# Patient Record
Sex: Female | Born: 1945 | Race: White | Hispanic: No | Marital: Married | Smoking: Never smoker
Health system: Southern US, Community
[De-identification: ages and names within clinical notes are randomized; demographics above are authoritative.]

## PROBLEM LIST (undated history)

## (undated) DIAGNOSIS — K589 Irritable bowel syndrome without diarrhea: Secondary | ICD-10-CM

## (undated) DIAGNOSIS — E7800 Pure hypercholesterolemia, unspecified: Secondary | ICD-10-CM

## (undated) DIAGNOSIS — F410 Panic disorder [episodic paroxysmal anxiety] without agoraphobia: Secondary | ICD-10-CM

## (undated) DIAGNOSIS — F32A Depression, unspecified: Secondary | ICD-10-CM

## (undated) DIAGNOSIS — M1990 Unspecified osteoarthritis, unspecified site: Secondary | ICD-10-CM

## (undated) DIAGNOSIS — F329 Major depressive disorder, single episode, unspecified: Secondary | ICD-10-CM

## (undated) DIAGNOSIS — I341 Nonrheumatic mitral (valve) prolapse: Secondary | ICD-10-CM

## (undated) DIAGNOSIS — F419 Anxiety disorder, unspecified: Secondary | ICD-10-CM

## (undated) DIAGNOSIS — K219 Gastro-esophageal reflux disease without esophagitis: Secondary | ICD-10-CM

## (undated) HISTORY — PX: ABDOMINAL HYSTERECTOMY: SHX81

## (undated) HISTORY — PX: TONSILLECTOMY: SUR1361

## (undated) HISTORY — DX: Unspecified osteoarthritis, unspecified site: M19.90

## (undated) HISTORY — DX: Pure hypercholesterolemia, unspecified: E78.00

## (undated) HISTORY — DX: Depression, unspecified: F32.A

## (undated) HISTORY — PX: BLADDER SURGERY: SHX569

## (undated) HISTORY — DX: Major depressive disorder, single episode, unspecified: F32.9

## (undated) HISTORY — DX: Irritable bowel syndrome, unspecified: K58.9

## (undated) HISTORY — DX: Gastro-esophageal reflux disease without esophagitis: K21.9

## (undated) HISTORY — PX: COLONOSCOPY WITH ESOPHAGOGASTRODUODENOSCOPY (EGD): SHX5779

## (undated) HISTORY — DX: Anxiety disorder, unspecified: F41.9

## (undated) HISTORY — DX: Nonrheumatic mitral (valve) prolapse: I34.1

## (undated) HISTORY — DX: Panic disorder (episodic paroxysmal anxiety): F41.0

## (undated) HISTORY — PX: BACK SURGERY: SHX140

---

## 1996-04-18 HISTORY — PX: HERNIA REPAIR: SHX51

## 2010-03-10 HISTORY — PX: ESOPHAGOGASTRODUODENOSCOPY: SHX1529

## 2011-04-21 DIAGNOSIS — H538 Other visual disturbances: Secondary | ICD-10-CM | POA: Diagnosis not present

## 2011-04-27 DIAGNOSIS — H26499 Other secondary cataract, unspecified eye: Secondary | ICD-10-CM | POA: Diagnosis not present

## 2011-05-14 DIAGNOSIS — J209 Acute bronchitis, unspecified: Secondary | ICD-10-CM | POA: Diagnosis not present

## 2011-05-14 DIAGNOSIS — J018 Other acute sinusitis: Secondary | ICD-10-CM | POA: Diagnosis not present

## 2011-05-16 DIAGNOSIS — J019 Acute sinusitis, unspecified: Secondary | ICD-10-CM | POA: Diagnosis not present

## 2011-07-14 DIAGNOSIS — L820 Inflamed seborrheic keratosis: Secondary | ICD-10-CM | POA: Diagnosis not present

## 2011-07-14 DIAGNOSIS — L719 Rosacea, unspecified: Secondary | ICD-10-CM | POA: Diagnosis not present

## 2011-07-14 DIAGNOSIS — L723 Sebaceous cyst: Secondary | ICD-10-CM | POA: Diagnosis not present

## 2011-08-17 DIAGNOSIS — L570 Actinic keratosis: Secondary | ICD-10-CM | POA: Diagnosis not present

## 2011-09-29 DIAGNOSIS — L821 Other seborrheic keratosis: Secondary | ICD-10-CM | POA: Diagnosis not present

## 2011-09-29 DIAGNOSIS — L723 Sebaceous cyst: Secondary | ICD-10-CM | POA: Diagnosis not present

## 2011-10-12 DIAGNOSIS — H029 Unspecified disorder of eyelid: Secondary | ICD-10-CM | POA: Diagnosis not present

## 2011-10-27 DIAGNOSIS — H11159 Pinguecula, unspecified eye: Secondary | ICD-10-CM | POA: Diagnosis not present

## 2011-12-01 DIAGNOSIS — IMO0001 Reserved for inherently not codable concepts without codable children: Secondary | ICD-10-CM | POA: Diagnosis not present

## 2011-12-01 DIAGNOSIS — D485 Neoplasm of uncertain behavior of skin: Secondary | ICD-10-CM | POA: Diagnosis not present

## 2011-12-01 DIAGNOSIS — L821 Other seborrheic keratosis: Secondary | ICD-10-CM | POA: Diagnosis not present

## 2011-12-01 DIAGNOSIS — D492 Neoplasm of unspecified behavior of bone, soft tissue, and skin: Secondary | ICD-10-CM | POA: Diagnosis not present

## 2011-12-29 DIAGNOSIS — Z23 Encounter for immunization: Secondary | ICD-10-CM | POA: Diagnosis not present

## 2012-03-27 DIAGNOSIS — M75100 Unspecified rotator cuff tear or rupture of unspecified shoulder, not specified as traumatic: Secondary | ICD-10-CM | POA: Diagnosis not present

## 2012-04-26 DIAGNOSIS — K219 Gastro-esophageal reflux disease without esophagitis: Secondary | ICD-10-CM | POA: Diagnosis not present

## 2012-04-26 DIAGNOSIS — Z79899 Other long term (current) drug therapy: Secondary | ICD-10-CM | POA: Diagnosis not present

## 2012-04-26 DIAGNOSIS — E7800 Pure hypercholesterolemia, unspecified: Secondary | ICD-10-CM | POA: Diagnosis not present

## 2012-04-26 DIAGNOSIS — F411 Generalized anxiety disorder: Secondary | ICD-10-CM | POA: Diagnosis not present

## 2012-05-09 DIAGNOSIS — Z0000 Encounter for general adult medical examination without abnormal findings: Secondary | ICD-10-CM | POA: Diagnosis not present

## 2012-06-05 DIAGNOSIS — M5090 Cervical disc disorder, unspecified, unspecified cervical region: Secondary | ICD-10-CM | POA: Diagnosis not present

## 2012-06-05 DIAGNOSIS — F411 Generalized anxiety disorder: Secondary | ICD-10-CM | POA: Diagnosis not present

## 2012-06-05 DIAGNOSIS — Z23 Encounter for immunization: Secondary | ICD-10-CM | POA: Diagnosis not present

## 2012-06-05 DIAGNOSIS — M75100 Unspecified rotator cuff tear or rupture of unspecified shoulder, not specified as traumatic: Secondary | ICD-10-CM | POA: Diagnosis not present

## 2012-06-13 DIAGNOSIS — M25519 Pain in unspecified shoulder: Secondary | ICD-10-CM | POA: Diagnosis not present

## 2012-06-14 DIAGNOSIS — Z1231 Encounter for screening mammogram for malignant neoplasm of breast: Secondary | ICD-10-CM | POA: Diagnosis not present

## 2012-06-21 DIAGNOSIS — M25519 Pain in unspecified shoulder: Secondary | ICD-10-CM | POA: Diagnosis not present

## 2012-06-26 DIAGNOSIS — M25519 Pain in unspecified shoulder: Secondary | ICD-10-CM | POA: Diagnosis not present

## 2012-06-28 DIAGNOSIS — M5137 Other intervertebral disc degeneration, lumbosacral region: Secondary | ICD-10-CM | POA: Diagnosis not present

## 2012-06-28 DIAGNOSIS — M999 Biomechanical lesion, unspecified: Secondary | ICD-10-CM | POA: Diagnosis not present

## 2012-06-28 DIAGNOSIS — M25519 Pain in unspecified shoulder: Secondary | ICD-10-CM | POA: Diagnosis not present

## 2012-06-29 DIAGNOSIS — M999 Biomechanical lesion, unspecified: Secondary | ICD-10-CM | POA: Diagnosis not present

## 2012-06-29 DIAGNOSIS — M5137 Other intervertebral disc degeneration, lumbosacral region: Secondary | ICD-10-CM | POA: Diagnosis not present

## 2012-06-29 DIAGNOSIS — M9981 Other biomechanical lesions of cervical region: Secondary | ICD-10-CM | POA: Diagnosis not present

## 2012-07-02 DIAGNOSIS — M9981 Other biomechanical lesions of cervical region: Secondary | ICD-10-CM | POA: Diagnosis not present

## 2012-07-03 DIAGNOSIS — M5137 Other intervertebral disc degeneration, lumbosacral region: Secondary | ICD-10-CM | POA: Diagnosis not present

## 2012-07-03 DIAGNOSIS — M9981 Other biomechanical lesions of cervical region: Secondary | ICD-10-CM | POA: Diagnosis not present

## 2012-07-03 DIAGNOSIS — M25519 Pain in unspecified shoulder: Secondary | ICD-10-CM | POA: Diagnosis not present

## 2012-07-06 DIAGNOSIS — M9981 Other biomechanical lesions of cervical region: Secondary | ICD-10-CM | POA: Diagnosis not present

## 2012-07-06 DIAGNOSIS — M999 Biomechanical lesion, unspecified: Secondary | ICD-10-CM | POA: Diagnosis not present

## 2012-07-06 DIAGNOSIS — M5137 Other intervertebral disc degeneration, lumbosacral region: Secondary | ICD-10-CM | POA: Diagnosis not present

## 2012-07-09 DIAGNOSIS — M5137 Other intervertebral disc degeneration, lumbosacral region: Secondary | ICD-10-CM | POA: Diagnosis not present

## 2012-07-09 DIAGNOSIS — M9981 Other biomechanical lesions of cervical region: Secondary | ICD-10-CM | POA: Diagnosis not present

## 2012-07-09 DIAGNOSIS — M999 Biomechanical lesion, unspecified: Secondary | ICD-10-CM | POA: Diagnosis not present

## 2012-07-10 DIAGNOSIS — M999 Biomechanical lesion, unspecified: Secondary | ICD-10-CM | POA: Diagnosis not present

## 2012-07-10 DIAGNOSIS — M25519 Pain in unspecified shoulder: Secondary | ICD-10-CM | POA: Diagnosis not present

## 2012-07-10 DIAGNOSIS — M5137 Other intervertebral disc degeneration, lumbosacral region: Secondary | ICD-10-CM | POA: Diagnosis not present

## 2012-07-10 DIAGNOSIS — M9981 Other biomechanical lesions of cervical region: Secondary | ICD-10-CM | POA: Diagnosis not present

## 2012-07-12 DIAGNOSIS — M25519 Pain in unspecified shoulder: Secondary | ICD-10-CM | POA: Diagnosis not present

## 2012-07-13 DIAGNOSIS — M9981 Other biomechanical lesions of cervical region: Secondary | ICD-10-CM | POA: Diagnosis not present

## 2012-07-13 DIAGNOSIS — M5137 Other intervertebral disc degeneration, lumbosacral region: Secondary | ICD-10-CM | POA: Diagnosis not present

## 2012-07-13 DIAGNOSIS — M999 Biomechanical lesion, unspecified: Secondary | ICD-10-CM | POA: Diagnosis not present

## 2012-07-16 DIAGNOSIS — M9981 Other biomechanical lesions of cervical region: Secondary | ICD-10-CM | POA: Diagnosis not present

## 2012-07-16 DIAGNOSIS — M999 Biomechanical lesion, unspecified: Secondary | ICD-10-CM | POA: Diagnosis not present

## 2012-07-16 DIAGNOSIS — M5137 Other intervertebral disc degeneration, lumbosacral region: Secondary | ICD-10-CM | POA: Diagnosis not present

## 2012-07-17 DIAGNOSIS — M25519 Pain in unspecified shoulder: Secondary | ICD-10-CM | POA: Diagnosis not present

## 2012-07-17 DIAGNOSIS — M999 Biomechanical lesion, unspecified: Secondary | ICD-10-CM | POA: Diagnosis not present

## 2012-07-17 DIAGNOSIS — M9981 Other biomechanical lesions of cervical region: Secondary | ICD-10-CM | POA: Diagnosis not present

## 2012-07-17 DIAGNOSIS — M5137 Other intervertebral disc degeneration, lumbosacral region: Secondary | ICD-10-CM | POA: Diagnosis not present

## 2012-07-20 DIAGNOSIS — M25519 Pain in unspecified shoulder: Secondary | ICD-10-CM | POA: Diagnosis not present

## 2012-07-23 DIAGNOSIS — M9981 Other biomechanical lesions of cervical region: Secondary | ICD-10-CM | POA: Diagnosis not present

## 2012-07-23 DIAGNOSIS — M25519 Pain in unspecified shoulder: Secondary | ICD-10-CM | POA: Diagnosis not present

## 2012-07-23 DIAGNOSIS — M5137 Other intervertebral disc degeneration, lumbosacral region: Secondary | ICD-10-CM | POA: Diagnosis not present

## 2012-07-23 DIAGNOSIS — M999 Biomechanical lesion, unspecified: Secondary | ICD-10-CM | POA: Diagnosis not present

## 2012-07-24 DIAGNOSIS — K5900 Constipation, unspecified: Secondary | ICD-10-CM | POA: Diagnosis not present

## 2012-07-24 DIAGNOSIS — K589 Irritable bowel syndrome without diarrhea: Secondary | ICD-10-CM | POA: Diagnosis not present

## 2012-07-24 DIAGNOSIS — Z1211 Encounter for screening for malignant neoplasm of colon: Secondary | ICD-10-CM | POA: Diagnosis not present

## 2012-07-25 DIAGNOSIS — M25519 Pain in unspecified shoulder: Secondary | ICD-10-CM | POA: Diagnosis not present

## 2012-07-26 DIAGNOSIS — Z23 Encounter for immunization: Secondary | ICD-10-CM | POA: Diagnosis not present

## 2012-07-30 DIAGNOSIS — M25519 Pain in unspecified shoulder: Secondary | ICD-10-CM | POA: Diagnosis not present

## 2012-08-06 DIAGNOSIS — S43499A Other sprain of unspecified shoulder joint, initial encounter: Secondary | ICD-10-CM | POA: Diagnosis not present

## 2012-08-06 DIAGNOSIS — S46819A Strain of other muscles, fascia and tendons at shoulder and upper arm level, unspecified arm, initial encounter: Secondary | ICD-10-CM | POA: Diagnosis not present

## 2012-08-06 DIAGNOSIS — M25819 Other specified joint disorders, unspecified shoulder: Secondary | ICD-10-CM | POA: Diagnosis not present

## 2012-08-06 DIAGNOSIS — X58XXXA Exposure to other specified factors, initial encounter: Secondary | ICD-10-CM | POA: Diagnosis not present

## 2012-08-06 DIAGNOSIS — M25519 Pain in unspecified shoulder: Secondary | ICD-10-CM | POA: Diagnosis not present

## 2012-08-06 DIAGNOSIS — M19019 Primary osteoarthritis, unspecified shoulder: Secondary | ICD-10-CM | POA: Diagnosis not present

## 2012-08-08 DIAGNOSIS — M66329 Spontaneous rupture of flexor tendons, unspecified upper arm: Secondary | ICD-10-CM | POA: Diagnosis not present

## 2012-08-08 DIAGNOSIS — M75120 Complete rotator cuff tear or rupture of unspecified shoulder, not specified as traumatic: Secondary | ICD-10-CM | POA: Diagnosis not present

## 2012-08-08 DIAGNOSIS — M6281 Muscle weakness (generalized): Secondary | ICD-10-CM | POA: Diagnosis not present

## 2012-08-22 DIAGNOSIS — M75120 Complete rotator cuff tear or rupture of unspecified shoulder, not specified as traumatic: Secondary | ICD-10-CM | POA: Diagnosis not present

## 2012-08-22 DIAGNOSIS — M25519 Pain in unspecified shoulder: Secondary | ICD-10-CM | POA: Diagnosis not present

## 2012-08-30 DIAGNOSIS — M75120 Complete rotator cuff tear or rupture of unspecified shoulder, not specified as traumatic: Secondary | ICD-10-CM | POA: Diagnosis not present

## 2012-08-30 DIAGNOSIS — M25519 Pain in unspecified shoulder: Secondary | ICD-10-CM | POA: Diagnosis not present

## 2012-09-04 DIAGNOSIS — M25519 Pain in unspecified shoulder: Secondary | ICD-10-CM | POA: Diagnosis not present

## 2012-09-04 DIAGNOSIS — M75120 Complete rotator cuff tear or rupture of unspecified shoulder, not specified as traumatic: Secondary | ICD-10-CM | POA: Diagnosis not present

## 2012-09-06 DIAGNOSIS — M25519 Pain in unspecified shoulder: Secondary | ICD-10-CM | POA: Diagnosis not present

## 2012-09-06 DIAGNOSIS — M75120 Complete rotator cuff tear or rupture of unspecified shoulder, not specified as traumatic: Secondary | ICD-10-CM | POA: Diagnosis not present

## 2012-09-12 DIAGNOSIS — M75120 Complete rotator cuff tear or rupture of unspecified shoulder, not specified as traumatic: Secondary | ICD-10-CM | POA: Diagnosis not present

## 2012-09-12 DIAGNOSIS — M25519 Pain in unspecified shoulder: Secondary | ICD-10-CM | POA: Diagnosis not present

## 2012-09-14 DIAGNOSIS — K589 Irritable bowel syndrome without diarrhea: Secondary | ICD-10-CM | POA: Diagnosis not present

## 2012-09-14 DIAGNOSIS — Z5309 Procedure and treatment not carried out because of other contraindication: Secondary | ICD-10-CM | POA: Diagnosis not present

## 2012-09-14 DIAGNOSIS — Z1211 Encounter for screening for malignant neoplasm of colon: Secondary | ICD-10-CM | POA: Diagnosis not present

## 2012-09-14 DIAGNOSIS — K5900 Constipation, unspecified: Secondary | ICD-10-CM | POA: Diagnosis not present

## 2012-09-20 DIAGNOSIS — M75120 Complete rotator cuff tear or rupture of unspecified shoulder, not specified as traumatic: Secondary | ICD-10-CM | POA: Diagnosis not present

## 2012-09-20 DIAGNOSIS — M25519 Pain in unspecified shoulder: Secondary | ICD-10-CM | POA: Diagnosis not present

## 2012-10-24 DIAGNOSIS — M75120 Complete rotator cuff tear or rupture of unspecified shoulder, not specified as traumatic: Secondary | ICD-10-CM | POA: Diagnosis not present

## 2012-10-25 DIAGNOSIS — K5900 Constipation, unspecified: Secondary | ICD-10-CM | POA: Diagnosis not present

## 2012-10-25 DIAGNOSIS — Z1211 Encounter for screening for malignant neoplasm of colon: Secondary | ICD-10-CM | POA: Diagnosis not present

## 2012-11-01 DIAGNOSIS — L719 Rosacea, unspecified: Secondary | ICD-10-CM | POA: Diagnosis not present

## 2012-11-01 DIAGNOSIS — L538 Other specified erythematous conditions: Secondary | ICD-10-CM | POA: Diagnosis not present

## 2012-11-09 DIAGNOSIS — Z1211 Encounter for screening for malignant neoplasm of colon: Secondary | ICD-10-CM | POA: Diagnosis not present

## 2012-11-09 DIAGNOSIS — K5730 Diverticulosis of large intestine without perforation or abscess without bleeding: Secondary | ICD-10-CM | POA: Diagnosis not present

## 2012-11-09 DIAGNOSIS — K648 Other hemorrhoids: Secondary | ICD-10-CM | POA: Diagnosis not present

## 2012-11-09 HISTORY — PX: COLONOSCOPY: SHX174

## 2012-12-31 DIAGNOSIS — H21239 Degeneration of iris (pigmentary), unspecified eye: Secondary | ICD-10-CM | POA: Diagnosis not present

## 2013-01-17 DIAGNOSIS — Z23 Encounter for immunization: Secondary | ICD-10-CM | POA: Diagnosis not present

## 2013-02-26 DIAGNOSIS — R079 Chest pain, unspecified: Secondary | ICD-10-CM | POA: Diagnosis not present

## 2013-02-26 DIAGNOSIS — S43429A Sprain of unspecified rotator cuff capsule, initial encounter: Secondary | ICD-10-CM | POA: Diagnosis not present

## 2013-02-27 DIAGNOSIS — Z006 Encounter for examination for normal comparison and control in clinical research program: Secondary | ICD-10-CM | POA: Diagnosis not present

## 2013-02-27 DIAGNOSIS — F411 Generalized anxiety disorder: Secondary | ICD-10-CM | POA: Diagnosis not present

## 2013-02-27 DIAGNOSIS — K219 Gastro-esophageal reflux disease without esophagitis: Secondary | ICD-10-CM | POA: Diagnosis not present

## 2013-02-27 DIAGNOSIS — E7800 Pure hypercholesterolemia, unspecified: Secondary | ICD-10-CM | POA: Diagnosis not present

## 2013-05-09 DIAGNOSIS — H16429 Pannus (corneal), unspecified eye: Secondary | ICD-10-CM | POA: Diagnosis not present

## 2013-05-16 DIAGNOSIS — F410 Panic disorder [episodic paroxysmal anxiety] without agoraphobia: Secondary | ICD-10-CM | POA: Diagnosis not present

## 2013-05-16 DIAGNOSIS — Z79899 Other long term (current) drug therapy: Secondary | ICD-10-CM | POA: Diagnosis not present

## 2013-05-16 DIAGNOSIS — F411 Generalized anxiety disorder: Secondary | ICD-10-CM | POA: Diagnosis not present

## 2013-05-16 DIAGNOSIS — E7800 Pure hypercholesterolemia, unspecified: Secondary | ICD-10-CM | POA: Diagnosis not present

## 2013-06-09 DIAGNOSIS — G8929 Other chronic pain: Secondary | ICD-10-CM | POA: Diagnosis not present

## 2013-06-09 DIAGNOSIS — K219 Gastro-esophageal reflux disease without esophagitis: Secondary | ICD-10-CM | POA: Diagnosis not present

## 2013-06-09 DIAGNOSIS — R51 Headache: Secondary | ICD-10-CM | POA: Diagnosis not present

## 2013-06-09 DIAGNOSIS — M542 Cervicalgia: Secondary | ICD-10-CM | POA: Diagnosis not present

## 2013-06-09 DIAGNOSIS — Z79899 Other long term (current) drug therapy: Secondary | ICD-10-CM | POA: Diagnosis not present

## 2013-06-09 DIAGNOSIS — E7800 Pure hypercholesterolemia, unspecified: Secondary | ICD-10-CM | POA: Diagnosis not present

## 2013-06-09 DIAGNOSIS — M545 Low back pain, unspecified: Secondary | ICD-10-CM | POA: Diagnosis not present

## 2013-07-01 DIAGNOSIS — H04129 Dry eye syndrome of unspecified lacrimal gland: Secondary | ICD-10-CM | POA: Diagnosis not present

## 2013-08-02 DIAGNOSIS — N644 Mastodynia: Secondary | ICD-10-CM | POA: Diagnosis not present

## 2013-08-02 DIAGNOSIS — E559 Vitamin D deficiency, unspecified: Secondary | ICD-10-CM | POA: Diagnosis not present

## 2013-08-02 DIAGNOSIS — Z0000 Encounter for general adult medical examination without abnormal findings: Secondary | ICD-10-CM | POA: Diagnosis not present

## 2013-08-02 DIAGNOSIS — M549 Dorsalgia, unspecified: Secondary | ICD-10-CM | POA: Diagnosis not present

## 2013-09-03 DIAGNOSIS — S339XXA Sprain of unspecified parts of lumbar spine and pelvis, initial encounter: Secondary | ICD-10-CM | POA: Diagnosis not present

## 2013-09-03 DIAGNOSIS — M999 Biomechanical lesion, unspecified: Secondary | ICD-10-CM | POA: Diagnosis not present

## 2013-09-04 DIAGNOSIS — M999 Biomechanical lesion, unspecified: Secondary | ICD-10-CM | POA: Diagnosis not present

## 2013-09-04 DIAGNOSIS — S339XXA Sprain of unspecified parts of lumbar spine and pelvis, initial encounter: Secondary | ICD-10-CM | POA: Diagnosis not present

## 2013-09-05 DIAGNOSIS — S339XXA Sprain of unspecified parts of lumbar spine and pelvis, initial encounter: Secondary | ICD-10-CM | POA: Diagnosis not present

## 2013-09-05 DIAGNOSIS — M999 Biomechanical lesion, unspecified: Secondary | ICD-10-CM | POA: Diagnosis not present

## 2013-09-10 DIAGNOSIS — M999 Biomechanical lesion, unspecified: Secondary | ICD-10-CM | POA: Diagnosis not present

## 2013-09-10 DIAGNOSIS — S339XXA Sprain of unspecified parts of lumbar spine and pelvis, initial encounter: Secondary | ICD-10-CM | POA: Diagnosis not present

## 2013-09-11 DIAGNOSIS — S339XXA Sprain of unspecified parts of lumbar spine and pelvis, initial encounter: Secondary | ICD-10-CM | POA: Diagnosis not present

## 2013-09-11 DIAGNOSIS — M999 Biomechanical lesion, unspecified: Secondary | ICD-10-CM | POA: Diagnosis not present

## 2013-09-12 DIAGNOSIS — S339XXA Sprain of unspecified parts of lumbar spine and pelvis, initial encounter: Secondary | ICD-10-CM | POA: Diagnosis not present

## 2013-09-12 DIAGNOSIS — M999 Biomechanical lesion, unspecified: Secondary | ICD-10-CM | POA: Diagnosis not present

## 2013-09-16 DIAGNOSIS — S339XXA Sprain of unspecified parts of lumbar spine and pelvis, initial encounter: Secondary | ICD-10-CM | POA: Diagnosis not present

## 2013-09-16 DIAGNOSIS — M999 Biomechanical lesion, unspecified: Secondary | ICD-10-CM | POA: Diagnosis not present

## 2013-09-17 DIAGNOSIS — M999 Biomechanical lesion, unspecified: Secondary | ICD-10-CM | POA: Diagnosis not present

## 2013-09-17 DIAGNOSIS — S339XXA Sprain of unspecified parts of lumbar spine and pelvis, initial encounter: Secondary | ICD-10-CM | POA: Diagnosis not present

## 2013-09-19 DIAGNOSIS — M999 Biomechanical lesion, unspecified: Secondary | ICD-10-CM | POA: Diagnosis not present

## 2013-09-19 DIAGNOSIS — S339XXA Sprain of unspecified parts of lumbar spine and pelvis, initial encounter: Secondary | ICD-10-CM | POA: Diagnosis not present

## 2013-09-23 DIAGNOSIS — M999 Biomechanical lesion, unspecified: Secondary | ICD-10-CM | POA: Diagnosis not present

## 2013-09-23 DIAGNOSIS — S339XXA Sprain of unspecified parts of lumbar spine and pelvis, initial encounter: Secondary | ICD-10-CM | POA: Diagnosis not present

## 2013-09-24 DIAGNOSIS — S339XXA Sprain of unspecified parts of lumbar spine and pelvis, initial encounter: Secondary | ICD-10-CM | POA: Diagnosis not present

## 2013-09-24 DIAGNOSIS — M999 Biomechanical lesion, unspecified: Secondary | ICD-10-CM | POA: Diagnosis not present

## 2013-09-26 DIAGNOSIS — S339XXA Sprain of unspecified parts of lumbar spine and pelvis, initial encounter: Secondary | ICD-10-CM | POA: Diagnosis not present

## 2013-09-26 DIAGNOSIS — M999 Biomechanical lesion, unspecified: Secondary | ICD-10-CM | POA: Diagnosis not present

## 2013-10-04 DIAGNOSIS — Z1231 Encounter for screening mammogram for malignant neoplasm of breast: Secondary | ICD-10-CM | POA: Diagnosis not present

## 2013-10-07 DIAGNOSIS — M999 Biomechanical lesion, unspecified: Secondary | ICD-10-CM | POA: Diagnosis not present

## 2013-10-07 DIAGNOSIS — S339XXA Sprain of unspecified parts of lumbar spine and pelvis, initial encounter: Secondary | ICD-10-CM | POA: Diagnosis not present

## 2013-10-10 DIAGNOSIS — M999 Biomechanical lesion, unspecified: Secondary | ICD-10-CM | POA: Diagnosis not present

## 2013-10-10 DIAGNOSIS — S339XXA Sprain of unspecified parts of lumbar spine and pelvis, initial encounter: Secondary | ICD-10-CM | POA: Diagnosis not present

## 2013-10-14 DIAGNOSIS — M999 Biomechanical lesion, unspecified: Secondary | ICD-10-CM | POA: Diagnosis not present

## 2013-10-14 DIAGNOSIS — S339XXA Sprain of unspecified parts of lumbar spine and pelvis, initial encounter: Secondary | ICD-10-CM | POA: Diagnosis not present

## 2013-10-17 DIAGNOSIS — F410 Panic disorder [episodic paroxysmal anxiety] without agoraphobia: Secondary | ICD-10-CM | POA: Diagnosis not present

## 2013-10-17 DIAGNOSIS — M999 Biomechanical lesion, unspecified: Secondary | ICD-10-CM | POA: Diagnosis not present

## 2013-10-17 DIAGNOSIS — E7800 Pure hypercholesterolemia, unspecified: Secondary | ICD-10-CM | POA: Diagnosis not present

## 2013-10-17 DIAGNOSIS — S339XXA Sprain of unspecified parts of lumbar spine and pelvis, initial encounter: Secondary | ICD-10-CM | POA: Diagnosis not present

## 2013-10-23 DIAGNOSIS — S339XXA Sprain of unspecified parts of lumbar spine and pelvis, initial encounter: Secondary | ICD-10-CM | POA: Diagnosis not present

## 2013-10-23 DIAGNOSIS — M999 Biomechanical lesion, unspecified: Secondary | ICD-10-CM | POA: Diagnosis not present

## 2013-11-06 DIAGNOSIS — M75120 Complete rotator cuff tear or rupture of unspecified shoulder, not specified as traumatic: Secondary | ICD-10-CM | POA: Diagnosis not present

## 2013-11-06 DIAGNOSIS — M999 Biomechanical lesion, unspecified: Secondary | ICD-10-CM | POA: Diagnosis not present

## 2013-11-06 DIAGNOSIS — M25519 Pain in unspecified shoulder: Secondary | ICD-10-CM | POA: Diagnosis not present

## 2013-11-06 DIAGNOSIS — S339XXA Sprain of unspecified parts of lumbar spine and pelvis, initial encounter: Secondary | ICD-10-CM | POA: Diagnosis not present

## 2013-11-07 DIAGNOSIS — D2330 Other benign neoplasm of skin of unspecified part of face: Secondary | ICD-10-CM | POA: Diagnosis not present

## 2013-11-07 DIAGNOSIS — L719 Rosacea, unspecified: Secondary | ICD-10-CM | POA: Diagnosis not present

## 2013-11-25 DIAGNOSIS — N952 Postmenopausal atrophic vaginitis: Secondary | ICD-10-CM | POA: Diagnosis not present

## 2013-11-25 DIAGNOSIS — N3941 Urge incontinence: Secondary | ICD-10-CM | POA: Diagnosis not present

## 2013-11-29 DIAGNOSIS — R413 Other amnesia: Secondary | ICD-10-CM | POA: Diagnosis not present

## 2014-01-06 DIAGNOSIS — Z23 Encounter for immunization: Secondary | ICD-10-CM | POA: Diagnosis not present

## 2014-02-12 DIAGNOSIS — S90852A Superficial foreign body, left foot, initial encounter: Secondary | ICD-10-CM | POA: Diagnosis not present

## 2014-02-13 ENCOUNTER — Ambulatory Visit: Payer: Self-pay

## 2014-03-24 DIAGNOSIS — M75121 Complete rotator cuff tear or rupture of right shoulder, not specified as traumatic: Secondary | ICD-10-CM | POA: Diagnosis not present

## 2014-03-24 DIAGNOSIS — M25511 Pain in right shoulder: Secondary | ICD-10-CM | POA: Diagnosis not present

## 2014-07-01 DIAGNOSIS — H26493 Other secondary cataract, bilateral: Secondary | ICD-10-CM | POA: Diagnosis not present

## 2014-07-01 DIAGNOSIS — H04123 Dry eye syndrome of bilateral lacrimal glands: Secondary | ICD-10-CM | POA: Diagnosis not present

## 2014-07-31 DIAGNOSIS — M9903 Segmental and somatic dysfunction of lumbar region: Secondary | ICD-10-CM | POA: Diagnosis not present

## 2014-07-31 DIAGNOSIS — S338XXA Sprain of other parts of lumbar spine and pelvis, initial encounter: Secondary | ICD-10-CM | POA: Diagnosis not present

## 2014-08-04 DIAGNOSIS — M9903 Segmental and somatic dysfunction of lumbar region: Secondary | ICD-10-CM | POA: Diagnosis not present

## 2014-08-04 DIAGNOSIS — S338XXA Sprain of other parts of lumbar spine and pelvis, initial encounter: Secondary | ICD-10-CM | POA: Diagnosis not present

## 2014-08-06 DIAGNOSIS — S338XXA Sprain of other parts of lumbar spine and pelvis, initial encounter: Secondary | ICD-10-CM | POA: Diagnosis not present

## 2014-08-06 DIAGNOSIS — M9903 Segmental and somatic dysfunction of lumbar region: Secondary | ICD-10-CM | POA: Diagnosis not present

## 2014-08-07 DIAGNOSIS — S338XXA Sprain of other parts of lumbar spine and pelvis, initial encounter: Secondary | ICD-10-CM | POA: Diagnosis not present

## 2014-08-07 DIAGNOSIS — M9903 Segmental and somatic dysfunction of lumbar region: Secondary | ICD-10-CM | POA: Diagnosis not present

## 2014-08-14 DIAGNOSIS — S338XXA Sprain of other parts of lumbar spine and pelvis, initial encounter: Secondary | ICD-10-CM | POA: Diagnosis not present

## 2014-08-14 DIAGNOSIS — M9903 Segmental and somatic dysfunction of lumbar region: Secondary | ICD-10-CM | POA: Diagnosis not present

## 2014-08-18 DIAGNOSIS — S338XXA Sprain of other parts of lumbar spine and pelvis, initial encounter: Secondary | ICD-10-CM | POA: Diagnosis not present

## 2014-08-18 DIAGNOSIS — M9903 Segmental and somatic dysfunction of lumbar region: Secondary | ICD-10-CM | POA: Diagnosis not present

## 2014-08-21 DIAGNOSIS — L814 Other melanin hyperpigmentation: Secondary | ICD-10-CM | POA: Diagnosis not present

## 2014-08-21 DIAGNOSIS — L719 Rosacea, unspecified: Secondary | ICD-10-CM | POA: Diagnosis not present

## 2014-08-21 DIAGNOSIS — L820 Inflamed seborrheic keratosis: Secondary | ICD-10-CM | POA: Diagnosis not present

## 2014-08-25 DIAGNOSIS — S338XXA Sprain of other parts of lumbar spine and pelvis, initial encounter: Secondary | ICD-10-CM | POA: Diagnosis not present

## 2014-08-25 DIAGNOSIS — M9903 Segmental and somatic dysfunction of lumbar region: Secondary | ICD-10-CM | POA: Diagnosis not present

## 2014-08-28 DIAGNOSIS — M9903 Segmental and somatic dysfunction of lumbar region: Secondary | ICD-10-CM | POA: Diagnosis not present

## 2014-08-28 DIAGNOSIS — S338XXA Sprain of other parts of lumbar spine and pelvis, initial encounter: Secondary | ICD-10-CM | POA: Diagnosis not present

## 2014-09-01 DIAGNOSIS — M9903 Segmental and somatic dysfunction of lumbar region: Secondary | ICD-10-CM | POA: Diagnosis not present

## 2014-09-01 DIAGNOSIS — S338XXA Sprain of other parts of lumbar spine and pelvis, initial encounter: Secondary | ICD-10-CM | POA: Diagnosis not present

## 2014-09-03 DIAGNOSIS — M25511 Pain in right shoulder: Secondary | ICD-10-CM | POA: Diagnosis not present

## 2014-09-03 DIAGNOSIS — M75121 Complete rotator cuff tear or rupture of right shoulder, not specified as traumatic: Secondary | ICD-10-CM | POA: Diagnosis not present

## 2014-09-08 DIAGNOSIS — M9903 Segmental and somatic dysfunction of lumbar region: Secondary | ICD-10-CM | POA: Diagnosis not present

## 2014-09-08 DIAGNOSIS — S338XXA Sprain of other parts of lumbar spine and pelvis, initial encounter: Secondary | ICD-10-CM | POA: Diagnosis not present

## 2014-09-11 DIAGNOSIS — M75101 Unspecified rotator cuff tear or rupture of right shoulder, not specified as traumatic: Secondary | ICD-10-CM | POA: Diagnosis not present

## 2014-09-11 DIAGNOSIS — M12811 Other specific arthropathies, not elsewhere classified, right shoulder: Secondary | ICD-10-CM | POA: Diagnosis not present

## 2014-09-11 DIAGNOSIS — M75121 Complete rotator cuff tear or rupture of right shoulder, not specified as traumatic: Secondary | ICD-10-CM | POA: Diagnosis not present

## 2014-09-11 DIAGNOSIS — M25411 Effusion, right shoulder: Secondary | ICD-10-CM | POA: Diagnosis not present

## 2014-09-19 DIAGNOSIS — M75121 Complete rotator cuff tear or rupture of right shoulder, not specified as traumatic: Secondary | ICD-10-CM | POA: Diagnosis not present

## 2014-09-25 DIAGNOSIS — J309 Allergic rhinitis, unspecified: Secondary | ICD-10-CM | POA: Diagnosis not present

## 2014-09-25 DIAGNOSIS — M75121 Complete rotator cuff tear or rupture of right shoulder, not specified as traumatic: Secondary | ICD-10-CM | POA: Diagnosis not present

## 2014-09-25 DIAGNOSIS — G8918 Other acute postprocedural pain: Secondary | ICD-10-CM | POA: Diagnosis not present

## 2014-09-25 DIAGNOSIS — Z7982 Long term (current) use of aspirin: Secondary | ICD-10-CM | POA: Diagnosis not present

## 2014-09-25 DIAGNOSIS — M7541 Impingement syndrome of right shoulder: Secondary | ICD-10-CM | POA: Diagnosis not present

## 2014-09-25 DIAGNOSIS — M7540 Impingement syndrome of unspecified shoulder: Secondary | ICD-10-CM | POA: Diagnosis not present

## 2014-09-25 DIAGNOSIS — S46811A Strain of other muscles, fascia and tendons at shoulder and upper arm level, right arm, initial encounter: Secondary | ICD-10-CM | POA: Diagnosis not present

## 2014-09-25 DIAGNOSIS — Z79899 Other long term (current) drug therapy: Secondary | ICD-10-CM | POA: Diagnosis not present

## 2014-09-25 DIAGNOSIS — F410 Panic disorder [episodic paroxysmal anxiety] without agoraphobia: Secondary | ICD-10-CM | POA: Diagnosis not present

## 2014-09-25 DIAGNOSIS — K219 Gastro-esophageal reflux disease without esophagitis: Secondary | ICD-10-CM | POA: Diagnosis not present

## 2014-09-25 DIAGNOSIS — M65811 Other synovitis and tenosynovitis, right shoulder: Secondary | ICD-10-CM | POA: Diagnosis not present

## 2014-09-25 DIAGNOSIS — E785 Hyperlipidemia, unspecified: Secondary | ICD-10-CM | POA: Diagnosis not present

## 2014-09-25 DIAGNOSIS — E559 Vitamin D deficiency, unspecified: Secondary | ICD-10-CM | POA: Diagnosis not present

## 2014-09-25 DIAGNOSIS — M25519 Pain in unspecified shoulder: Secondary | ICD-10-CM | POA: Diagnosis not present

## 2014-09-25 DIAGNOSIS — K760 Fatty (change of) liver, not elsewhere classified: Secondary | ICD-10-CM | POA: Diagnosis not present

## 2014-09-29 DIAGNOSIS — M25511 Pain in right shoulder: Secondary | ICD-10-CM | POA: Diagnosis not present

## 2014-09-29 DIAGNOSIS — M75121 Complete rotator cuff tear or rupture of right shoulder, not specified as traumatic: Secondary | ICD-10-CM | POA: Diagnosis not present

## 2014-09-29 DIAGNOSIS — M25611 Stiffness of right shoulder, not elsewhere classified: Secondary | ICD-10-CM | POA: Diagnosis not present

## 2014-09-29 DIAGNOSIS — M6281 Muscle weakness (generalized): Secondary | ICD-10-CM | POA: Diagnosis not present

## 2014-10-01 DIAGNOSIS — M6281 Muscle weakness (generalized): Secondary | ICD-10-CM | POA: Diagnosis not present

## 2014-10-01 DIAGNOSIS — M25511 Pain in right shoulder: Secondary | ICD-10-CM | POA: Diagnosis not present

## 2014-10-01 DIAGNOSIS — M25611 Stiffness of right shoulder, not elsewhere classified: Secondary | ICD-10-CM | POA: Diagnosis not present

## 2014-10-01 DIAGNOSIS — M75121 Complete rotator cuff tear or rupture of right shoulder, not specified as traumatic: Secondary | ICD-10-CM | POA: Diagnosis not present

## 2014-10-06 DIAGNOSIS — M6281 Muscle weakness (generalized): Secondary | ICD-10-CM | POA: Diagnosis not present

## 2014-10-06 DIAGNOSIS — M25611 Stiffness of right shoulder, not elsewhere classified: Secondary | ICD-10-CM | POA: Diagnosis not present

## 2014-10-06 DIAGNOSIS — M25511 Pain in right shoulder: Secondary | ICD-10-CM | POA: Diagnosis not present

## 2014-10-06 DIAGNOSIS — M75121 Complete rotator cuff tear or rupture of right shoulder, not specified as traumatic: Secondary | ICD-10-CM | POA: Diagnosis not present

## 2014-10-08 DIAGNOSIS — M25611 Stiffness of right shoulder, not elsewhere classified: Secondary | ICD-10-CM | POA: Diagnosis not present

## 2014-10-08 DIAGNOSIS — M75121 Complete rotator cuff tear or rupture of right shoulder, not specified as traumatic: Secondary | ICD-10-CM | POA: Diagnosis not present

## 2014-10-08 DIAGNOSIS — M6281 Muscle weakness (generalized): Secondary | ICD-10-CM | POA: Diagnosis not present

## 2014-10-08 DIAGNOSIS — M25511 Pain in right shoulder: Secondary | ICD-10-CM | POA: Diagnosis not present

## 2014-10-10 DIAGNOSIS — M75121 Complete rotator cuff tear or rupture of right shoulder, not specified as traumatic: Secondary | ICD-10-CM | POA: Diagnosis not present

## 2014-10-10 DIAGNOSIS — M25611 Stiffness of right shoulder, not elsewhere classified: Secondary | ICD-10-CM | POA: Diagnosis not present

## 2014-10-10 DIAGNOSIS — M25511 Pain in right shoulder: Secondary | ICD-10-CM | POA: Diagnosis not present

## 2014-10-10 DIAGNOSIS — M6281 Muscle weakness (generalized): Secondary | ICD-10-CM | POA: Diagnosis not present

## 2014-10-13 DIAGNOSIS — M75121 Complete rotator cuff tear or rupture of right shoulder, not specified as traumatic: Secondary | ICD-10-CM | POA: Diagnosis not present

## 2014-10-13 DIAGNOSIS — M25611 Stiffness of right shoulder, not elsewhere classified: Secondary | ICD-10-CM | POA: Diagnosis not present

## 2014-10-13 DIAGNOSIS — M25511 Pain in right shoulder: Secondary | ICD-10-CM | POA: Diagnosis not present

## 2014-10-13 DIAGNOSIS — M6281 Muscle weakness (generalized): Secondary | ICD-10-CM | POA: Diagnosis not present

## 2014-10-15 DIAGNOSIS — M25511 Pain in right shoulder: Secondary | ICD-10-CM | POA: Diagnosis not present

## 2014-10-15 DIAGNOSIS — M25611 Stiffness of right shoulder, not elsewhere classified: Secondary | ICD-10-CM | POA: Diagnosis not present

## 2014-10-15 DIAGNOSIS — M75121 Complete rotator cuff tear or rupture of right shoulder, not specified as traumatic: Secondary | ICD-10-CM | POA: Diagnosis not present

## 2014-10-15 DIAGNOSIS — M6281 Muscle weakness (generalized): Secondary | ICD-10-CM | POA: Diagnosis not present

## 2014-10-22 DIAGNOSIS — M25511 Pain in right shoulder: Secondary | ICD-10-CM | POA: Diagnosis not present

## 2014-10-22 DIAGNOSIS — M6281 Muscle weakness (generalized): Secondary | ICD-10-CM | POA: Diagnosis not present

## 2014-10-22 DIAGNOSIS — M25611 Stiffness of right shoulder, not elsewhere classified: Secondary | ICD-10-CM | POA: Diagnosis not present

## 2014-10-22 DIAGNOSIS — M75121 Complete rotator cuff tear or rupture of right shoulder, not specified as traumatic: Secondary | ICD-10-CM | POA: Diagnosis not present

## 2014-10-24 DIAGNOSIS — M25611 Stiffness of right shoulder, not elsewhere classified: Secondary | ICD-10-CM | POA: Diagnosis not present

## 2014-10-24 DIAGNOSIS — M25511 Pain in right shoulder: Secondary | ICD-10-CM | POA: Diagnosis not present

## 2014-10-24 DIAGNOSIS — M6281 Muscle weakness (generalized): Secondary | ICD-10-CM | POA: Diagnosis not present

## 2014-10-24 DIAGNOSIS — M75121 Complete rotator cuff tear or rupture of right shoulder, not specified as traumatic: Secondary | ICD-10-CM | POA: Diagnosis not present

## 2014-10-27 DIAGNOSIS — M25611 Stiffness of right shoulder, not elsewhere classified: Secondary | ICD-10-CM | POA: Diagnosis not present

## 2014-10-27 DIAGNOSIS — M75121 Complete rotator cuff tear or rupture of right shoulder, not specified as traumatic: Secondary | ICD-10-CM | POA: Diagnosis not present

## 2014-10-27 DIAGNOSIS — M6281 Muscle weakness (generalized): Secondary | ICD-10-CM | POA: Diagnosis not present

## 2014-10-27 DIAGNOSIS — M25511 Pain in right shoulder: Secondary | ICD-10-CM | POA: Diagnosis not present

## 2014-10-29 DIAGNOSIS — M6281 Muscle weakness (generalized): Secondary | ICD-10-CM | POA: Diagnosis not present

## 2014-10-29 DIAGNOSIS — M25611 Stiffness of right shoulder, not elsewhere classified: Secondary | ICD-10-CM | POA: Diagnosis not present

## 2014-10-29 DIAGNOSIS — M25511 Pain in right shoulder: Secondary | ICD-10-CM | POA: Diagnosis not present

## 2014-10-29 DIAGNOSIS — M75121 Complete rotator cuff tear or rupture of right shoulder, not specified as traumatic: Secondary | ICD-10-CM | POA: Diagnosis not present

## 2014-10-31 DIAGNOSIS — M25611 Stiffness of right shoulder, not elsewhere classified: Secondary | ICD-10-CM | POA: Diagnosis not present

## 2014-10-31 DIAGNOSIS — M25511 Pain in right shoulder: Secondary | ICD-10-CM | POA: Diagnosis not present

## 2014-10-31 DIAGNOSIS — M6281 Muscle weakness (generalized): Secondary | ICD-10-CM | POA: Diagnosis not present

## 2014-10-31 DIAGNOSIS — M75121 Complete rotator cuff tear or rupture of right shoulder, not specified as traumatic: Secondary | ICD-10-CM | POA: Diagnosis not present

## 2014-11-03 DIAGNOSIS — M25611 Stiffness of right shoulder, not elsewhere classified: Secondary | ICD-10-CM | POA: Diagnosis not present

## 2014-11-03 DIAGNOSIS — M25511 Pain in right shoulder: Secondary | ICD-10-CM | POA: Diagnosis not present

## 2014-11-03 DIAGNOSIS — M75121 Complete rotator cuff tear or rupture of right shoulder, not specified as traumatic: Secondary | ICD-10-CM | POA: Diagnosis not present

## 2014-11-03 DIAGNOSIS — M6281 Muscle weakness (generalized): Secondary | ICD-10-CM | POA: Diagnosis not present

## 2014-11-05 DIAGNOSIS — M25511 Pain in right shoulder: Secondary | ICD-10-CM | POA: Diagnosis not present

## 2014-11-05 DIAGNOSIS — M75121 Complete rotator cuff tear or rupture of right shoulder, not specified as traumatic: Secondary | ICD-10-CM | POA: Diagnosis not present

## 2014-11-05 DIAGNOSIS — M25611 Stiffness of right shoulder, not elsewhere classified: Secondary | ICD-10-CM | POA: Diagnosis not present

## 2014-11-05 DIAGNOSIS — M6281 Muscle weakness (generalized): Secondary | ICD-10-CM | POA: Diagnosis not present

## 2014-11-11 DIAGNOSIS — Z0000 Encounter for general adult medical examination without abnormal findings: Secondary | ICD-10-CM | POA: Diagnosis not present

## 2014-11-11 DIAGNOSIS — Z79899 Other long term (current) drug therapy: Secondary | ICD-10-CM | POA: Diagnosis not present

## 2014-11-11 DIAGNOSIS — F410 Panic disorder [episodic paroxysmal anxiety] without agoraphobia: Secondary | ICD-10-CM | POA: Diagnosis not present

## 2014-11-11 DIAGNOSIS — K219 Gastro-esophageal reflux disease without esophagitis: Secondary | ICD-10-CM | POA: Diagnosis not present

## 2014-11-11 DIAGNOSIS — E780 Pure hypercholesterolemia: Secondary | ICD-10-CM | POA: Diagnosis not present

## 2014-11-12 DIAGNOSIS — M75121 Complete rotator cuff tear or rupture of right shoulder, not specified as traumatic: Secondary | ICD-10-CM | POA: Diagnosis not present

## 2014-11-12 DIAGNOSIS — M25511 Pain in right shoulder: Secondary | ICD-10-CM | POA: Diagnosis not present

## 2014-11-12 DIAGNOSIS — M25611 Stiffness of right shoulder, not elsewhere classified: Secondary | ICD-10-CM | POA: Diagnosis not present

## 2014-11-12 DIAGNOSIS — M6281 Muscle weakness (generalized): Secondary | ICD-10-CM | POA: Diagnosis not present

## 2014-11-14 DIAGNOSIS — M6281 Muscle weakness (generalized): Secondary | ICD-10-CM | POA: Diagnosis not present

## 2014-11-14 DIAGNOSIS — M25511 Pain in right shoulder: Secondary | ICD-10-CM | POA: Diagnosis not present

## 2014-11-14 DIAGNOSIS — M25611 Stiffness of right shoulder, not elsewhere classified: Secondary | ICD-10-CM | POA: Diagnosis not present

## 2014-11-14 DIAGNOSIS — M75121 Complete rotator cuff tear or rupture of right shoulder, not specified as traumatic: Secondary | ICD-10-CM | POA: Diagnosis not present

## 2014-11-17 DIAGNOSIS — N644 Mastodynia: Secondary | ICD-10-CM | POA: Diagnosis not present

## 2014-11-19 DIAGNOSIS — M6281 Muscle weakness (generalized): Secondary | ICD-10-CM | POA: Diagnosis not present

## 2014-11-19 DIAGNOSIS — M25511 Pain in right shoulder: Secondary | ICD-10-CM | POA: Diagnosis not present

## 2014-11-19 DIAGNOSIS — M25611 Stiffness of right shoulder, not elsewhere classified: Secondary | ICD-10-CM | POA: Diagnosis not present

## 2014-11-19 DIAGNOSIS — M75121 Complete rotator cuff tear or rupture of right shoulder, not specified as traumatic: Secondary | ICD-10-CM | POA: Diagnosis not present

## 2014-11-21 DIAGNOSIS — M25611 Stiffness of right shoulder, not elsewhere classified: Secondary | ICD-10-CM | POA: Diagnosis not present

## 2014-11-21 DIAGNOSIS — M6281 Muscle weakness (generalized): Secondary | ICD-10-CM | POA: Diagnosis not present

## 2014-11-21 DIAGNOSIS — M75121 Complete rotator cuff tear or rupture of right shoulder, not specified as traumatic: Secondary | ICD-10-CM | POA: Diagnosis not present

## 2014-11-21 DIAGNOSIS — M1990 Unspecified osteoarthritis, unspecified site: Secondary | ICD-10-CM | POA: Diagnosis not present

## 2014-11-21 DIAGNOSIS — M25511 Pain in right shoulder: Secondary | ICD-10-CM | POA: Diagnosis not present

## 2014-11-25 DIAGNOSIS — M25611 Stiffness of right shoulder, not elsewhere classified: Secondary | ICD-10-CM | POA: Diagnosis not present

## 2014-11-25 DIAGNOSIS — M6281 Muscle weakness (generalized): Secondary | ICD-10-CM | POA: Diagnosis not present

## 2014-11-25 DIAGNOSIS — M75121 Complete rotator cuff tear or rupture of right shoulder, not specified as traumatic: Secondary | ICD-10-CM | POA: Diagnosis not present

## 2014-11-25 DIAGNOSIS — M25511 Pain in right shoulder: Secondary | ICD-10-CM | POA: Diagnosis not present

## 2014-12-03 DIAGNOSIS — M25511 Pain in right shoulder: Secondary | ICD-10-CM | POA: Diagnosis not present

## 2014-12-03 DIAGNOSIS — M25611 Stiffness of right shoulder, not elsewhere classified: Secondary | ICD-10-CM | POA: Diagnosis not present

## 2014-12-03 DIAGNOSIS — M75121 Complete rotator cuff tear or rupture of right shoulder, not specified as traumatic: Secondary | ICD-10-CM | POA: Diagnosis not present

## 2014-12-03 DIAGNOSIS — M6281 Muscle weakness (generalized): Secondary | ICD-10-CM | POA: Diagnosis not present

## 2014-12-05 DIAGNOSIS — M6281 Muscle weakness (generalized): Secondary | ICD-10-CM | POA: Diagnosis not present

## 2014-12-05 DIAGNOSIS — M25611 Stiffness of right shoulder, not elsewhere classified: Secondary | ICD-10-CM | POA: Diagnosis not present

## 2014-12-05 DIAGNOSIS — M25511 Pain in right shoulder: Secondary | ICD-10-CM | POA: Diagnosis not present

## 2014-12-05 DIAGNOSIS — M75121 Complete rotator cuff tear or rupture of right shoulder, not specified as traumatic: Secondary | ICD-10-CM | POA: Diagnosis not present

## 2014-12-10 DIAGNOSIS — M6281 Muscle weakness (generalized): Secondary | ICD-10-CM | POA: Diagnosis not present

## 2014-12-10 DIAGNOSIS — M25511 Pain in right shoulder: Secondary | ICD-10-CM | POA: Diagnosis not present

## 2014-12-10 DIAGNOSIS — M75121 Complete rotator cuff tear or rupture of right shoulder, not specified as traumatic: Secondary | ICD-10-CM | POA: Diagnosis not present

## 2014-12-10 DIAGNOSIS — M25611 Stiffness of right shoulder, not elsewhere classified: Secondary | ICD-10-CM | POA: Diagnosis not present

## 2014-12-12 DIAGNOSIS — M75121 Complete rotator cuff tear or rupture of right shoulder, not specified as traumatic: Secondary | ICD-10-CM | POA: Diagnosis not present

## 2014-12-12 DIAGNOSIS — M25511 Pain in right shoulder: Secondary | ICD-10-CM | POA: Diagnosis not present

## 2014-12-12 DIAGNOSIS — M6281 Muscle weakness (generalized): Secondary | ICD-10-CM | POA: Diagnosis not present

## 2014-12-12 DIAGNOSIS — M25611 Stiffness of right shoulder, not elsewhere classified: Secondary | ICD-10-CM | POA: Diagnosis not present

## 2014-12-24 DIAGNOSIS — J40 Bronchitis, not specified as acute or chronic: Secondary | ICD-10-CM | POA: Diagnosis not present

## 2014-12-24 DIAGNOSIS — J329 Chronic sinusitis, unspecified: Secondary | ICD-10-CM | POA: Diagnosis not present

## 2015-01-15 DIAGNOSIS — J40 Bronchitis, not specified as acute or chronic: Secondary | ICD-10-CM | POA: Diagnosis not present

## 2015-02-04 DIAGNOSIS — M9903 Segmental and somatic dysfunction of lumbar region: Secondary | ICD-10-CM | POA: Diagnosis not present

## 2015-02-04 DIAGNOSIS — S338XXA Sprain of other parts of lumbar spine and pelvis, initial encounter: Secondary | ICD-10-CM | POA: Diagnosis not present

## 2015-02-05 DIAGNOSIS — M9903 Segmental and somatic dysfunction of lumbar region: Secondary | ICD-10-CM | POA: Diagnosis not present

## 2015-02-05 DIAGNOSIS — R413 Other amnesia: Secondary | ICD-10-CM | POA: Diagnosis not present

## 2015-02-05 DIAGNOSIS — S338XXA Sprain of other parts of lumbar spine and pelvis, initial encounter: Secondary | ICD-10-CM | POA: Diagnosis not present

## 2015-02-09 DIAGNOSIS — Z23 Encounter for immunization: Secondary | ICD-10-CM | POA: Diagnosis not present

## 2015-02-09 DIAGNOSIS — M9903 Segmental and somatic dysfunction of lumbar region: Secondary | ICD-10-CM | POA: Diagnosis not present

## 2015-02-09 DIAGNOSIS — S338XXA Sprain of other parts of lumbar spine and pelvis, initial encounter: Secondary | ICD-10-CM | POA: Diagnosis not present

## 2015-02-16 DIAGNOSIS — M5441 Lumbago with sciatica, right side: Secondary | ICD-10-CM | POA: Diagnosis not present

## 2015-03-03 DIAGNOSIS — M4726 Other spondylosis with radiculopathy, lumbar region: Secondary | ICD-10-CM | POA: Diagnosis not present

## 2015-03-03 DIAGNOSIS — M40209 Unspecified kyphosis, site unspecified: Secondary | ICD-10-CM | POA: Diagnosis not present

## 2015-03-03 DIAGNOSIS — M5441 Lumbago with sciatica, right side: Secondary | ICD-10-CM | POA: Diagnosis not present

## 2015-03-03 DIAGNOSIS — M545 Low back pain: Secondary | ICD-10-CM | POA: Diagnosis not present

## 2015-03-06 DIAGNOSIS — M5126 Other intervertebral disc displacement, lumbar region: Secondary | ICD-10-CM | POA: Diagnosis not present

## 2015-03-06 DIAGNOSIS — M4726 Other spondylosis with radiculopathy, lumbar region: Secondary | ICD-10-CM | POA: Diagnosis not present

## 2015-03-10 DIAGNOSIS — M25551 Pain in right hip: Secondary | ICD-10-CM | POA: Diagnosis not present

## 2015-03-10 DIAGNOSIS — M545 Low back pain: Secondary | ICD-10-CM | POA: Diagnosis not present

## 2015-03-17 DIAGNOSIS — M25551 Pain in right hip: Secondary | ICD-10-CM | POA: Diagnosis not present

## 2015-03-17 DIAGNOSIS — M545 Low back pain: Secondary | ICD-10-CM | POA: Diagnosis not present

## 2015-03-19 DIAGNOSIS — M25551 Pain in right hip: Secondary | ICD-10-CM | POA: Diagnosis not present

## 2015-03-19 DIAGNOSIS — M545 Low back pain: Secondary | ICD-10-CM | POA: Diagnosis not present

## 2015-03-24 DIAGNOSIS — M545 Low back pain: Secondary | ICD-10-CM | POA: Diagnosis not present

## 2015-03-24 DIAGNOSIS — M25551 Pain in right hip: Secondary | ICD-10-CM | POA: Diagnosis not present

## 2015-03-26 DIAGNOSIS — M419 Scoliosis, unspecified: Secondary | ICD-10-CM | POA: Diagnosis not present

## 2015-03-26 DIAGNOSIS — M4726 Other spondylosis with radiculopathy, lumbar region: Secondary | ICD-10-CM | POA: Diagnosis not present

## 2015-03-26 DIAGNOSIS — M545 Low back pain: Secondary | ICD-10-CM | POA: Diagnosis not present

## 2015-03-26 DIAGNOSIS — M5441 Lumbago with sciatica, right side: Secondary | ICD-10-CM | POA: Diagnosis not present

## 2015-03-30 DIAGNOSIS — M25512 Pain in left shoulder: Secondary | ICD-10-CM | POA: Diagnosis not present

## 2015-03-30 DIAGNOSIS — M7542 Impingement syndrome of left shoulder: Secondary | ICD-10-CM | POA: Diagnosis not present

## 2015-04-01 DIAGNOSIS — M47817 Spondylosis without myelopathy or radiculopathy, lumbosacral region: Secondary | ICD-10-CM | POA: Diagnosis not present

## 2015-04-24 DIAGNOSIS — M47817 Spondylosis without myelopathy or radiculopathy, lumbosacral region: Secondary | ICD-10-CM | POA: Diagnosis not present

## 2015-04-24 DIAGNOSIS — M419 Scoliosis, unspecified: Secondary | ICD-10-CM | POA: Diagnosis not present

## 2015-04-24 DIAGNOSIS — M545 Low back pain: Secondary | ICD-10-CM | POA: Diagnosis not present

## 2015-04-28 DIAGNOSIS — M47817 Spondylosis without myelopathy or radiculopathy, lumbosacral region: Secondary | ICD-10-CM | POA: Diagnosis not present

## 2015-05-13 DIAGNOSIS — E785 Hyperlipidemia, unspecified: Secondary | ICD-10-CM | POA: Diagnosis not present

## 2015-05-13 DIAGNOSIS — K219 Gastro-esophageal reflux disease without esophagitis: Secondary | ICD-10-CM | POA: Diagnosis not present

## 2015-05-13 DIAGNOSIS — Z79899 Other long term (current) drug therapy: Secondary | ICD-10-CM | POA: Diagnosis not present

## 2015-05-13 DIAGNOSIS — R5383 Other fatigue: Secondary | ICD-10-CM | POA: Diagnosis not present

## 2015-05-28 DIAGNOSIS — J0100 Acute maxillary sinusitis, unspecified: Secondary | ICD-10-CM | POA: Diagnosis not present

## 2015-06-26 DIAGNOSIS — J069 Acute upper respiratory infection, unspecified: Secondary | ICD-10-CM | POA: Diagnosis not present

## 2015-06-30 DIAGNOSIS — J069 Acute upper respiratory infection, unspecified: Secondary | ICD-10-CM | POA: Diagnosis not present

## 2015-07-02 DIAGNOSIS — J0100 Acute maxillary sinusitis, unspecified: Secondary | ICD-10-CM | POA: Diagnosis not present

## 2015-07-02 DIAGNOSIS — R251 Tremor, unspecified: Secondary | ICD-10-CM | POA: Diagnosis not present

## 2015-07-03 DIAGNOSIS — Z885 Allergy status to narcotic agent status: Secondary | ICD-10-CM | POA: Diagnosis not present

## 2015-07-03 DIAGNOSIS — Z79899 Other long term (current) drug therapy: Secondary | ICD-10-CM | POA: Diagnosis not present

## 2015-07-03 DIAGNOSIS — T380X5A Adverse effect of glucocorticoids and synthetic analogues, initial encounter: Secondary | ICD-10-CM | POA: Diagnosis not present

## 2015-07-03 DIAGNOSIS — Z888 Allergy status to other drugs, medicaments and biological substances status: Secondary | ICD-10-CM | POA: Diagnosis not present

## 2015-07-03 DIAGNOSIS — R0602 Shortness of breath: Secondary | ICD-10-CM | POA: Diagnosis not present

## 2015-07-03 DIAGNOSIS — Z7982 Long term (current) use of aspirin: Secondary | ICD-10-CM | POA: Diagnosis not present

## 2015-07-03 DIAGNOSIS — R251 Tremor, unspecified: Secondary | ICD-10-CM | POA: Diagnosis not present

## 2015-07-07 DIAGNOSIS — H04123 Dry eye syndrome of bilateral lacrimal glands: Secondary | ICD-10-CM | POA: Diagnosis not present

## 2015-07-07 DIAGNOSIS — H26493 Other secondary cataract, bilateral: Secondary | ICD-10-CM | POA: Diagnosis not present

## 2015-07-09 DIAGNOSIS — F418 Other specified anxiety disorders: Secondary | ICD-10-CM | POA: Diagnosis not present

## 2015-07-09 DIAGNOSIS — R413 Other amnesia: Secondary | ICD-10-CM | POA: Diagnosis not present

## 2015-07-09 DIAGNOSIS — E785 Hyperlipidemia, unspecified: Secondary | ICD-10-CM | POA: Diagnosis not present

## 2015-07-09 DIAGNOSIS — K219 Gastro-esophageal reflux disease without esophagitis: Secondary | ICD-10-CM | POA: Diagnosis not present

## 2015-07-09 DIAGNOSIS — L719 Rosacea, unspecified: Secondary | ICD-10-CM | POA: Diagnosis not present

## 2015-07-14 DIAGNOSIS — F330 Major depressive disorder, recurrent, mild: Secondary | ICD-10-CM | POA: Diagnosis not present

## 2015-07-14 DIAGNOSIS — F410 Panic disorder [episodic paroxysmal anxiety] without agoraphobia: Secondary | ICD-10-CM | POA: Diagnosis not present

## 2015-07-14 DIAGNOSIS — R413 Other amnesia: Secondary | ICD-10-CM | POA: Diagnosis not present

## 2015-07-15 DIAGNOSIS — M47817 Spondylosis without myelopathy or radiculopathy, lumbosacral region: Secondary | ICD-10-CM | POA: Diagnosis not present

## 2015-08-06 DIAGNOSIS — F410 Panic disorder [episodic paroxysmal anxiety] without agoraphobia: Secondary | ICD-10-CM | POA: Diagnosis not present

## 2015-08-06 DIAGNOSIS — F3341 Major depressive disorder, recurrent, in partial remission: Secondary | ICD-10-CM | POA: Diagnosis not present

## 2015-08-06 DIAGNOSIS — Z1389 Encounter for screening for other disorder: Secondary | ICD-10-CM | POA: Diagnosis not present

## 2015-08-06 DIAGNOSIS — R413 Other amnesia: Secondary | ICD-10-CM | POA: Diagnosis not present

## 2015-08-13 DIAGNOSIS — M47812 Spondylosis without myelopathy or radiculopathy, cervical region: Secondary | ICD-10-CM | POA: Diagnosis not present

## 2015-08-13 DIAGNOSIS — M5416 Radiculopathy, lumbar region: Secondary | ICD-10-CM | POA: Diagnosis not present

## 2015-08-13 DIAGNOSIS — M47817 Spondylosis without myelopathy or radiculopathy, lumbosacral region: Secondary | ICD-10-CM | POA: Diagnosis not present

## 2015-08-13 DIAGNOSIS — M545 Low back pain: Secondary | ICD-10-CM | POA: Diagnosis not present

## 2015-08-17 DIAGNOSIS — M19011 Primary osteoarthritis, right shoulder: Secondary | ICD-10-CM | POA: Diagnosis not present

## 2015-08-17 DIAGNOSIS — Z9889 Other specified postprocedural states: Secondary | ICD-10-CM | POA: Diagnosis not present

## 2015-08-21 DIAGNOSIS — Z136 Encounter for screening for cardiovascular disorders: Secondary | ICD-10-CM | POA: Diagnosis not present

## 2015-08-21 DIAGNOSIS — E785 Hyperlipidemia, unspecified: Secondary | ICD-10-CM | POA: Diagnosis not present

## 2015-08-25 DIAGNOSIS — M5489 Other dorsalgia: Secondary | ICD-10-CM | POA: Diagnosis not present

## 2015-08-25 DIAGNOSIS — M546 Pain in thoracic spine: Secondary | ICD-10-CM | POA: Diagnosis not present

## 2015-08-25 DIAGNOSIS — M542 Cervicalgia: Secondary | ICD-10-CM | POA: Diagnosis not present

## 2015-08-25 DIAGNOSIS — M545 Low back pain: Secondary | ICD-10-CM | POA: Diagnosis not present

## 2015-08-27 DIAGNOSIS — M5489 Other dorsalgia: Secondary | ICD-10-CM | POA: Diagnosis not present

## 2015-08-27 DIAGNOSIS — M545 Low back pain: Secondary | ICD-10-CM | POA: Diagnosis not present

## 2015-08-27 DIAGNOSIS — M546 Pain in thoracic spine: Secondary | ICD-10-CM | POA: Diagnosis not present

## 2015-08-27 DIAGNOSIS — M542 Cervicalgia: Secondary | ICD-10-CM | POA: Diagnosis not present

## 2015-09-01 DIAGNOSIS — M545 Low back pain: Secondary | ICD-10-CM | POA: Diagnosis not present

## 2015-09-01 DIAGNOSIS — M546 Pain in thoracic spine: Secondary | ICD-10-CM | POA: Diagnosis not present

## 2015-09-01 DIAGNOSIS — M5489 Other dorsalgia: Secondary | ICD-10-CM | POA: Diagnosis not present

## 2015-09-01 DIAGNOSIS — K219 Gastro-esophageal reflux disease without esophagitis: Secondary | ICD-10-CM | POA: Diagnosis not present

## 2015-09-01 DIAGNOSIS — M542 Cervicalgia: Secondary | ICD-10-CM | POA: Diagnosis not present

## 2015-09-01 DIAGNOSIS — R413 Other amnesia: Secondary | ICD-10-CM | POA: Diagnosis not present

## 2015-09-01 DIAGNOSIS — F410 Panic disorder [episodic paroxysmal anxiety] without agoraphobia: Secondary | ICD-10-CM | POA: Diagnosis not present

## 2015-09-03 DIAGNOSIS — M545 Low back pain: Secondary | ICD-10-CM | POA: Diagnosis not present

## 2015-09-03 DIAGNOSIS — M5489 Other dorsalgia: Secondary | ICD-10-CM | POA: Diagnosis not present

## 2015-09-03 DIAGNOSIS — M546 Pain in thoracic spine: Secondary | ICD-10-CM | POA: Diagnosis not present

## 2015-09-03 DIAGNOSIS — M542 Cervicalgia: Secondary | ICD-10-CM | POA: Diagnosis not present

## 2015-09-08 DIAGNOSIS — M542 Cervicalgia: Secondary | ICD-10-CM | POA: Diagnosis not present

## 2015-09-08 DIAGNOSIS — M545 Low back pain: Secondary | ICD-10-CM | POA: Diagnosis not present

## 2015-09-08 DIAGNOSIS — M5489 Other dorsalgia: Secondary | ICD-10-CM | POA: Diagnosis not present

## 2015-09-08 DIAGNOSIS — M546 Pain in thoracic spine: Secondary | ICD-10-CM | POA: Diagnosis not present

## 2015-09-16 DIAGNOSIS — M545 Low back pain: Secondary | ICD-10-CM | POA: Diagnosis not present

## 2015-09-16 DIAGNOSIS — M546 Pain in thoracic spine: Secondary | ICD-10-CM | POA: Diagnosis not present

## 2015-09-16 DIAGNOSIS — M5489 Other dorsalgia: Secondary | ICD-10-CM | POA: Diagnosis not present

## 2015-09-16 DIAGNOSIS — M542 Cervicalgia: Secondary | ICD-10-CM | POA: Diagnosis not present

## 2015-09-18 DIAGNOSIS — M546 Pain in thoracic spine: Secondary | ICD-10-CM | POA: Diagnosis not present

## 2015-09-18 DIAGNOSIS — M542 Cervicalgia: Secondary | ICD-10-CM | POA: Diagnosis not present

## 2015-09-18 DIAGNOSIS — M5489 Other dorsalgia: Secondary | ICD-10-CM | POA: Diagnosis not present

## 2015-09-18 DIAGNOSIS — M545 Low back pain: Secondary | ICD-10-CM | POA: Diagnosis not present

## 2015-09-22 DIAGNOSIS — M545 Low back pain: Secondary | ICD-10-CM | POA: Diagnosis not present

## 2015-09-22 DIAGNOSIS — M546 Pain in thoracic spine: Secondary | ICD-10-CM | POA: Diagnosis not present

## 2015-09-22 DIAGNOSIS — M542 Cervicalgia: Secondary | ICD-10-CM | POA: Diagnosis not present

## 2015-09-22 DIAGNOSIS — M5489 Other dorsalgia: Secondary | ICD-10-CM | POA: Diagnosis not present

## 2015-09-23 DIAGNOSIS — M5416 Radiculopathy, lumbar region: Secondary | ICD-10-CM | POA: Diagnosis not present

## 2015-09-23 DIAGNOSIS — M47817 Spondylosis without myelopathy or radiculopathy, lumbosacral region: Secondary | ICD-10-CM | POA: Diagnosis not present

## 2015-09-23 DIAGNOSIS — M545 Low back pain: Secondary | ICD-10-CM | POA: Diagnosis not present

## 2015-09-25 DIAGNOSIS — M5489 Other dorsalgia: Secondary | ICD-10-CM | POA: Diagnosis not present

## 2015-09-25 DIAGNOSIS — M545 Low back pain: Secondary | ICD-10-CM | POA: Diagnosis not present

## 2015-09-25 DIAGNOSIS — M542 Cervicalgia: Secondary | ICD-10-CM | POA: Diagnosis not present

## 2015-09-25 DIAGNOSIS — M546 Pain in thoracic spine: Secondary | ICD-10-CM | POA: Diagnosis not present

## 2015-09-28 DIAGNOSIS — M5489 Other dorsalgia: Secondary | ICD-10-CM | POA: Diagnosis not present

## 2015-09-28 DIAGNOSIS — M545 Low back pain: Secondary | ICD-10-CM | POA: Diagnosis not present

## 2015-09-28 DIAGNOSIS — M542 Cervicalgia: Secondary | ICD-10-CM | POA: Diagnosis not present

## 2015-09-28 DIAGNOSIS — M546 Pain in thoracic spine: Secondary | ICD-10-CM | POA: Diagnosis not present

## 2015-10-05 DIAGNOSIS — M5416 Radiculopathy, lumbar region: Secondary | ICD-10-CM | POA: Diagnosis not present

## 2015-10-14 DIAGNOSIS — M546 Pain in thoracic spine: Secondary | ICD-10-CM | POA: Diagnosis not present

## 2015-10-14 DIAGNOSIS — M5489 Other dorsalgia: Secondary | ICD-10-CM | POA: Diagnosis not present

## 2015-10-14 DIAGNOSIS — M545 Low back pain: Secondary | ICD-10-CM | POA: Diagnosis not present

## 2015-10-14 DIAGNOSIS — M542 Cervicalgia: Secondary | ICD-10-CM | POA: Diagnosis not present

## 2015-10-15 DIAGNOSIS — L820 Inflamed seborrheic keratosis: Secondary | ICD-10-CM | POA: Diagnosis not present

## 2015-10-15 DIAGNOSIS — L719 Rosacea, unspecified: Secondary | ICD-10-CM | POA: Diagnosis not present

## 2015-10-15 DIAGNOSIS — L720 Epidermal cyst: Secondary | ICD-10-CM | POA: Diagnosis not present

## 2015-10-15 DIAGNOSIS — L728 Other follicular cysts of the skin and subcutaneous tissue: Secondary | ICD-10-CM | POA: Diagnosis not present

## 2015-11-11 DIAGNOSIS — R05 Cough: Secondary | ICD-10-CM | POA: Diagnosis not present

## 2015-11-11 DIAGNOSIS — J029 Acute pharyngitis, unspecified: Secondary | ICD-10-CM | POA: Diagnosis not present

## 2015-11-11 DIAGNOSIS — J22 Unspecified acute lower respiratory infection: Secondary | ICD-10-CM | POA: Diagnosis not present

## 2015-11-23 DIAGNOSIS — K219 Gastro-esophageal reflux disease without esophagitis: Secondary | ICD-10-CM | POA: Diagnosis not present

## 2015-12-11 DIAGNOSIS — M47817 Spondylosis without myelopathy or radiculopathy, lumbosacral region: Secondary | ICD-10-CM | POA: Diagnosis not present

## 2015-12-11 DIAGNOSIS — M419 Scoliosis, unspecified: Secondary | ICD-10-CM | POA: Diagnosis not present

## 2015-12-11 DIAGNOSIS — M4726 Other spondylosis with radiculopathy, lumbar region: Secondary | ICD-10-CM | POA: Diagnosis not present

## 2015-12-11 DIAGNOSIS — M47812 Spondylosis without myelopathy or radiculopathy, cervical region: Secondary | ICD-10-CM | POA: Diagnosis not present

## 2016-01-01 DIAGNOSIS — E559 Vitamin D deficiency, unspecified: Secondary | ICD-10-CM | POA: Diagnosis not present

## 2016-01-01 DIAGNOSIS — Z136 Encounter for screening for cardiovascular disorders: Secondary | ICD-10-CM | POA: Diagnosis not present

## 2016-01-01 DIAGNOSIS — Z0000 Encounter for general adult medical examination without abnormal findings: Secondary | ICD-10-CM | POA: Diagnosis not present

## 2016-01-01 DIAGNOSIS — Z23 Encounter for immunization: Secondary | ICD-10-CM | POA: Diagnosis not present

## 2016-01-01 DIAGNOSIS — Z9181 History of falling: Secondary | ICD-10-CM | POA: Diagnosis not present

## 2016-01-01 DIAGNOSIS — E785 Hyperlipidemia, unspecified: Secondary | ICD-10-CM | POA: Diagnosis not present

## 2016-01-01 DIAGNOSIS — Z1159 Encounter for screening for other viral diseases: Secondary | ICD-10-CM | POA: Diagnosis not present

## 2016-01-04 DIAGNOSIS — M25512 Pain in left shoulder: Secondary | ICD-10-CM | POA: Diagnosis not present

## 2016-01-09 DIAGNOSIS — S46012A Strain of muscle(s) and tendon(s) of the rotator cuff of left shoulder, initial encounter: Secondary | ICD-10-CM | POA: Diagnosis not present

## 2016-01-09 DIAGNOSIS — S46112A Strain of muscle, fascia and tendon of long head of biceps, left arm, initial encounter: Secondary | ICD-10-CM | POA: Diagnosis not present

## 2016-01-09 DIAGNOSIS — X58XXXA Exposure to other specified factors, initial encounter: Secondary | ICD-10-CM | POA: Diagnosis not present

## 2016-01-09 DIAGNOSIS — S46812A Strain of other muscles, fascia and tendons at shoulder and upper arm level, left arm, initial encounter: Secondary | ICD-10-CM | POA: Diagnosis not present

## 2016-01-11 DIAGNOSIS — M19012 Primary osteoarthritis, left shoulder: Secondary | ICD-10-CM | POA: Diagnosis not present

## 2016-01-11 DIAGNOSIS — M25512 Pain in left shoulder: Secondary | ICD-10-CM | POA: Diagnosis not present

## 2016-01-11 DIAGNOSIS — M75122 Complete rotator cuff tear or rupture of left shoulder, not specified as traumatic: Secondary | ICD-10-CM | POA: Diagnosis not present

## 2016-01-12 DIAGNOSIS — Z79899 Other long term (current) drug therapy: Secondary | ICD-10-CM | POA: Diagnosis not present

## 2016-01-12 DIAGNOSIS — E785 Hyperlipidemia, unspecified: Secondary | ICD-10-CM | POA: Diagnosis not present

## 2016-01-12 DIAGNOSIS — F410 Panic disorder [episodic paroxysmal anxiety] without agoraphobia: Secondary | ICD-10-CM | POA: Diagnosis not present

## 2016-01-12 DIAGNOSIS — F330 Major depressive disorder, recurrent, mild: Secondary | ICD-10-CM | POA: Diagnosis not present

## 2016-01-12 DIAGNOSIS — R413 Other amnesia: Secondary | ICD-10-CM | POA: Diagnosis not present

## 2016-01-12 DIAGNOSIS — Z87898 Personal history of other specified conditions: Secondary | ICD-10-CM | POA: Diagnosis not present

## 2016-01-18 DIAGNOSIS — N959 Unspecified menopausal and perimenopausal disorder: Secondary | ICD-10-CM | POA: Diagnosis not present

## 2016-01-18 DIAGNOSIS — Z1231 Encounter for screening mammogram for malignant neoplasm of breast: Secondary | ICD-10-CM | POA: Diagnosis not present

## 2016-02-10 DIAGNOSIS — M85851 Other specified disorders of bone density and structure, right thigh: Secondary | ICD-10-CM | POA: Diagnosis not present

## 2016-02-10 DIAGNOSIS — N959 Unspecified menopausal and perimenopausal disorder: Secondary | ICD-10-CM | POA: Diagnosis not present

## 2016-02-10 DIAGNOSIS — M8588 Other specified disorders of bone density and structure, other site: Secondary | ICD-10-CM | POA: Diagnosis not present

## 2016-02-19 DIAGNOSIS — H04123 Dry eye syndrome of bilateral lacrimal glands: Secondary | ICD-10-CM | POA: Diagnosis not present

## 2016-02-19 DIAGNOSIS — L739 Follicular disorder, unspecified: Secondary | ICD-10-CM | POA: Diagnosis not present

## 2016-03-09 DIAGNOSIS — H04123 Dry eye syndrome of bilateral lacrimal glands: Secondary | ICD-10-CM | POA: Diagnosis not present

## 2016-03-09 DIAGNOSIS — L739 Follicular disorder, unspecified: Secondary | ICD-10-CM | POA: Diagnosis not present

## 2016-06-24 DIAGNOSIS — H01002 Unspecified blepharitis right lower eyelid: Secondary | ICD-10-CM | POA: Diagnosis not present

## 2016-06-24 DIAGNOSIS — H01005 Unspecified blepharitis left lower eyelid: Secondary | ICD-10-CM | POA: Diagnosis not present

## 2016-06-24 DIAGNOSIS — H04123 Dry eye syndrome of bilateral lacrimal glands: Secondary | ICD-10-CM | POA: Diagnosis not present

## 2016-07-11 DIAGNOSIS — E785 Hyperlipidemia, unspecified: Secondary | ICD-10-CM | POA: Diagnosis not present

## 2016-07-11 DIAGNOSIS — F410 Panic disorder [episodic paroxysmal anxiety] without agoraphobia: Secondary | ICD-10-CM | POA: Diagnosis not present

## 2016-07-11 DIAGNOSIS — M2550 Pain in unspecified joint: Secondary | ICD-10-CM | POA: Diagnosis not present

## 2016-07-11 DIAGNOSIS — Z87898 Personal history of other specified conditions: Secondary | ICD-10-CM | POA: Diagnosis not present

## 2016-07-11 DIAGNOSIS — Z79899 Other long term (current) drug therapy: Secondary | ICD-10-CM | POA: Diagnosis not present

## 2016-07-11 DIAGNOSIS — N3941 Urge incontinence: Secondary | ICD-10-CM | POA: Diagnosis not present

## 2016-07-28 DIAGNOSIS — H04123 Dry eye syndrome of bilateral lacrimal glands: Secondary | ICD-10-CM | POA: Diagnosis not present

## 2016-09-16 DIAGNOSIS — M5416 Radiculopathy, lumbar region: Secondary | ICD-10-CM | POA: Diagnosis not present

## 2016-09-16 DIAGNOSIS — M545 Low back pain: Secondary | ICD-10-CM | POA: Diagnosis not present

## 2016-09-16 DIAGNOSIS — M791 Myalgia: Secondary | ICD-10-CM | POA: Diagnosis not present

## 2016-09-16 DIAGNOSIS — M419 Scoliosis, unspecified: Secondary | ICD-10-CM | POA: Diagnosis not present

## 2016-10-14 DIAGNOSIS — M47817 Spondylosis without myelopathy or radiculopathy, lumbosacral region: Secondary | ICD-10-CM | POA: Diagnosis not present

## 2016-10-14 DIAGNOSIS — M419 Scoliosis, unspecified: Secondary | ICD-10-CM | POA: Diagnosis not present

## 2016-10-14 DIAGNOSIS — M545 Low back pain: Secondary | ICD-10-CM | POA: Diagnosis not present

## 2016-10-14 DIAGNOSIS — M791 Myalgia: Secondary | ICD-10-CM | POA: Diagnosis not present

## 2016-11-10 DIAGNOSIS — H903 Sensorineural hearing loss, bilateral: Secondary | ICD-10-CM | POA: Diagnosis not present

## 2016-11-10 DIAGNOSIS — H9193 Unspecified hearing loss, bilateral: Secondary | ICD-10-CM | POA: Diagnosis not present

## 2016-11-18 DIAGNOSIS — M47817 Spondylosis without myelopathy or radiculopathy, lumbosacral region: Secondary | ICD-10-CM | POA: Diagnosis not present

## 2016-11-18 DIAGNOSIS — M47812 Spondylosis without myelopathy or radiculopathy, cervical region: Secondary | ICD-10-CM | POA: Diagnosis not present

## 2016-11-18 DIAGNOSIS — M791 Myalgia: Secondary | ICD-10-CM | POA: Diagnosis not present

## 2017-01-09 DIAGNOSIS — Z23 Encounter for immunization: Secondary | ICD-10-CM | POA: Diagnosis not present

## 2017-01-13 DIAGNOSIS — Z6822 Body mass index (BMI) 22.0-22.9, adult: Secondary | ICD-10-CM | POA: Diagnosis not present

## 2017-01-13 DIAGNOSIS — M791 Myalgia: Secondary | ICD-10-CM | POA: Diagnosis not present

## 2017-01-13 DIAGNOSIS — M47817 Spondylosis without myelopathy or radiculopathy, lumbosacral region: Secondary | ICD-10-CM | POA: Diagnosis not present

## 2017-01-13 DIAGNOSIS — M545 Low back pain: Secondary | ICD-10-CM | POA: Diagnosis not present

## 2017-01-19 DIAGNOSIS — R32 Unspecified urinary incontinence: Secondary | ICD-10-CM | POA: Diagnosis not present

## 2017-01-19 DIAGNOSIS — Z79899 Other long term (current) drug therapy: Secondary | ICD-10-CM | POA: Diagnosis not present

## 2017-01-19 DIAGNOSIS — F410 Panic disorder [episodic paroxysmal anxiety] without agoraphobia: Secondary | ICD-10-CM | POA: Diagnosis not present

## 2017-01-19 DIAGNOSIS — R413 Other amnesia: Secondary | ICD-10-CM | POA: Diagnosis not present

## 2017-01-19 DIAGNOSIS — E785 Hyperlipidemia, unspecified: Secondary | ICD-10-CM | POA: Diagnosis not present

## 2017-01-26 DIAGNOSIS — M545 Low back pain: Secondary | ICD-10-CM | POA: Diagnosis not present

## 2017-01-26 DIAGNOSIS — M7918 Myalgia, other site: Secondary | ICD-10-CM | POA: Diagnosis not present

## 2017-01-30 DIAGNOSIS — M545 Low back pain: Secondary | ICD-10-CM | POA: Diagnosis not present

## 2017-01-30 DIAGNOSIS — M7918 Myalgia, other site: Secondary | ICD-10-CM | POA: Diagnosis not present

## 2017-02-07 DIAGNOSIS — M7918 Myalgia, other site: Secondary | ICD-10-CM | POA: Diagnosis not present

## 2017-02-07 DIAGNOSIS — M545 Low back pain: Secondary | ICD-10-CM | POA: Diagnosis not present

## 2017-02-08 DIAGNOSIS — M5416 Radiculopathy, lumbar region: Secondary | ICD-10-CM | POA: Diagnosis not present

## 2017-02-08 DIAGNOSIS — R32 Unspecified urinary incontinence: Secondary | ICD-10-CM | POA: Diagnosis not present

## 2017-02-15 DIAGNOSIS — J309 Allergic rhinitis, unspecified: Secondary | ICD-10-CM | POA: Diagnosis not present

## 2017-02-15 DIAGNOSIS — Z79899 Other long term (current) drug therapy: Secondary | ICD-10-CM | POA: Diagnosis not present

## 2017-02-15 DIAGNOSIS — R413 Other amnesia: Secondary | ICD-10-CM | POA: Diagnosis not present

## 2017-02-15 DIAGNOSIS — E559 Vitamin D deficiency, unspecified: Secondary | ICD-10-CM | POA: Diagnosis not present

## 2017-02-15 DIAGNOSIS — F3341 Major depressive disorder, recurrent, in partial remission: Secondary | ICD-10-CM | POA: Diagnosis not present

## 2017-02-15 DIAGNOSIS — F330 Major depressive disorder, recurrent, mild: Secondary | ICD-10-CM | POA: Diagnosis not present

## 2017-02-15 DIAGNOSIS — Z1389 Encounter for screening for other disorder: Secondary | ICD-10-CM | POA: Diagnosis not present

## 2017-02-16 DIAGNOSIS — M545 Low back pain: Secondary | ICD-10-CM | POA: Diagnosis not present

## 2017-02-16 DIAGNOSIS — M7918 Myalgia, other site: Secondary | ICD-10-CM | POA: Diagnosis not present

## 2017-02-22 DIAGNOSIS — M7918 Myalgia, other site: Secondary | ICD-10-CM | POA: Diagnosis not present

## 2017-02-22 DIAGNOSIS — M545 Low back pain: Secondary | ICD-10-CM | POA: Diagnosis not present

## 2017-02-28 DIAGNOSIS — M7918 Myalgia, other site: Secondary | ICD-10-CM | POA: Diagnosis not present

## 2017-02-28 DIAGNOSIS — M545 Low back pain: Secondary | ICD-10-CM | POA: Diagnosis not present

## 2017-03-07 DIAGNOSIS — M545 Low back pain: Secondary | ICD-10-CM | POA: Diagnosis not present

## 2017-03-07 DIAGNOSIS — M7918 Myalgia, other site: Secondary | ICD-10-CM | POA: Diagnosis not present

## 2017-03-13 DIAGNOSIS — R32 Unspecified urinary incontinence: Secondary | ICD-10-CM | POA: Diagnosis not present

## 2017-03-13 DIAGNOSIS — N952 Postmenopausal atrophic vaginitis: Secondary | ICD-10-CM | POA: Diagnosis not present

## 2017-03-14 DIAGNOSIS — M7918 Myalgia, other site: Secondary | ICD-10-CM | POA: Diagnosis not present

## 2017-03-14 DIAGNOSIS — M545 Low back pain: Secondary | ICD-10-CM | POA: Diagnosis not present

## 2017-03-16 DIAGNOSIS — M419 Scoliosis, unspecified: Secondary | ICD-10-CM | POA: Diagnosis not present

## 2017-03-16 DIAGNOSIS — M545 Low back pain: Secondary | ICD-10-CM | POA: Diagnosis not present

## 2017-03-16 DIAGNOSIS — M47817 Spondylosis without myelopathy or radiculopathy, lumbosacral region: Secondary | ICD-10-CM | POA: Diagnosis not present

## 2017-03-28 DIAGNOSIS — M5416 Radiculopathy, lumbar region: Secondary | ICD-10-CM | POA: Diagnosis not present

## 2017-04-05 DIAGNOSIS — E785 Hyperlipidemia, unspecified: Secondary | ICD-10-CM | POA: Diagnosis not present

## 2017-04-05 DIAGNOSIS — Z136 Encounter for screening for cardiovascular disorders: Secondary | ICD-10-CM | POA: Diagnosis not present

## 2017-04-05 DIAGNOSIS — Z1389 Encounter for screening for other disorder: Secondary | ICD-10-CM | POA: Diagnosis not present

## 2017-04-05 DIAGNOSIS — Z0000 Encounter for general adult medical examination without abnormal findings: Secondary | ICD-10-CM | POA: Diagnosis not present

## 2017-04-25 DIAGNOSIS — M7918 Myalgia, other site: Secondary | ICD-10-CM | POA: Diagnosis not present

## 2017-04-25 DIAGNOSIS — M419 Scoliosis, unspecified: Secondary | ICD-10-CM | POA: Diagnosis not present

## 2017-04-25 DIAGNOSIS — M47817 Spondylosis without myelopathy or radiculopathy, lumbosacral region: Secondary | ICD-10-CM | POA: Diagnosis not present

## 2017-04-25 DIAGNOSIS — M47816 Spondylosis without myelopathy or radiculopathy, lumbar region: Secondary | ICD-10-CM | POA: Diagnosis not present

## 2017-04-26 DIAGNOSIS — Z1231 Encounter for screening mammogram for malignant neoplasm of breast: Secondary | ICD-10-CM | POA: Diagnosis not present

## 2017-05-04 DIAGNOSIS — M419 Scoliosis, unspecified: Secondary | ICD-10-CM | POA: Diagnosis not present

## 2017-05-04 DIAGNOSIS — M48061 Spinal stenosis, lumbar region without neurogenic claudication: Secondary | ICD-10-CM | POA: Diagnosis not present

## 2017-05-10 DIAGNOSIS — H04123 Dry eye syndrome of bilateral lacrimal glands: Secondary | ICD-10-CM | POA: Diagnosis not present

## 2017-05-16 DIAGNOSIS — M7918 Myalgia, other site: Secondary | ICD-10-CM | POA: Diagnosis not present

## 2017-05-16 DIAGNOSIS — M5441 Lumbago with sciatica, right side: Secondary | ICD-10-CM | POA: Diagnosis not present

## 2017-05-16 DIAGNOSIS — Z4689 Encounter for fitting and adjustment of other specified devices: Secondary | ICD-10-CM | POA: Diagnosis not present

## 2017-05-16 DIAGNOSIS — Z6822 Body mass index (BMI) 22.0-22.9, adult: Secondary | ICD-10-CM | POA: Diagnosis not present

## 2017-05-16 DIAGNOSIS — M419 Scoliosis, unspecified: Secondary | ICD-10-CM | POA: Diagnosis not present

## 2017-05-24 DIAGNOSIS — M25511 Pain in right shoulder: Secondary | ICD-10-CM | POA: Diagnosis not present

## 2017-05-24 DIAGNOSIS — R413 Other amnesia: Secondary | ICD-10-CM | POA: Diagnosis not present

## 2017-05-24 DIAGNOSIS — E785 Hyperlipidemia, unspecified: Secondary | ICD-10-CM | POA: Diagnosis not present

## 2017-05-24 DIAGNOSIS — F410 Panic disorder [episodic paroxysmal anxiety] without agoraphobia: Secondary | ICD-10-CM | POA: Diagnosis not present

## 2017-05-24 DIAGNOSIS — G4700 Insomnia, unspecified: Secondary | ICD-10-CM | POA: Diagnosis not present

## 2017-05-24 DIAGNOSIS — M25512 Pain in left shoulder: Secondary | ICD-10-CM | POA: Diagnosis not present

## 2017-05-24 DIAGNOSIS — Z79899 Other long term (current) drug therapy: Secondary | ICD-10-CM | POA: Diagnosis not present

## 2017-05-24 DIAGNOSIS — Z1211 Encounter for screening for malignant neoplasm of colon: Secondary | ICD-10-CM | POA: Diagnosis not present

## 2017-05-24 DIAGNOSIS — F3341 Major depressive disorder, recurrent, in partial remission: Secondary | ICD-10-CM | POA: Diagnosis not present

## 2017-05-24 DIAGNOSIS — G8929 Other chronic pain: Secondary | ICD-10-CM | POA: Diagnosis not present

## 2017-05-29 DIAGNOSIS — M545 Low back pain: Secondary | ICD-10-CM | POA: Diagnosis not present

## 2017-05-29 DIAGNOSIS — M25519 Pain in unspecified shoulder: Secondary | ICD-10-CM | POA: Diagnosis not present

## 2017-05-29 DIAGNOSIS — M546 Pain in thoracic spine: Secondary | ICD-10-CM | POA: Diagnosis not present

## 2017-05-29 DIAGNOSIS — M5441 Lumbago with sciatica, right side: Secondary | ICD-10-CM | POA: Diagnosis not present

## 2017-06-01 DIAGNOSIS — M5441 Lumbago with sciatica, right side: Secondary | ICD-10-CM | POA: Diagnosis not present

## 2017-06-01 DIAGNOSIS — M545 Low back pain: Secondary | ICD-10-CM | POA: Diagnosis not present

## 2017-06-01 DIAGNOSIS — M546 Pain in thoracic spine: Secondary | ICD-10-CM | POA: Diagnosis not present

## 2017-06-01 DIAGNOSIS — M25519 Pain in unspecified shoulder: Secondary | ICD-10-CM | POA: Diagnosis not present

## 2017-06-08 DIAGNOSIS — M546 Pain in thoracic spine: Secondary | ICD-10-CM | POA: Diagnosis not present

## 2017-06-08 DIAGNOSIS — M5441 Lumbago with sciatica, right side: Secondary | ICD-10-CM | POA: Diagnosis not present

## 2017-06-08 DIAGNOSIS — M545 Low back pain: Secondary | ICD-10-CM | POA: Diagnosis not present

## 2017-06-08 DIAGNOSIS — M25519 Pain in unspecified shoulder: Secondary | ICD-10-CM | POA: Diagnosis not present

## 2017-06-12 DIAGNOSIS — M25519 Pain in unspecified shoulder: Secondary | ICD-10-CM | POA: Diagnosis not present

## 2017-06-12 DIAGNOSIS — M545 Low back pain: Secondary | ICD-10-CM | POA: Diagnosis not present

## 2017-06-12 DIAGNOSIS — M5441 Lumbago with sciatica, right side: Secondary | ICD-10-CM | POA: Diagnosis not present

## 2017-06-12 DIAGNOSIS — M546 Pain in thoracic spine: Secondary | ICD-10-CM | POA: Diagnosis not present

## 2017-06-15 DIAGNOSIS — M545 Low back pain: Secondary | ICD-10-CM | POA: Diagnosis not present

## 2017-06-15 DIAGNOSIS — M5441 Lumbago with sciatica, right side: Secondary | ICD-10-CM | POA: Diagnosis not present

## 2017-06-15 DIAGNOSIS — M25519 Pain in unspecified shoulder: Secondary | ICD-10-CM | POA: Diagnosis not present

## 2017-06-15 DIAGNOSIS — M546 Pain in thoracic spine: Secondary | ICD-10-CM | POA: Diagnosis not present

## 2017-06-19 DIAGNOSIS — M25519 Pain in unspecified shoulder: Secondary | ICD-10-CM | POA: Diagnosis not present

## 2017-06-19 DIAGNOSIS — M545 Low back pain: Secondary | ICD-10-CM | POA: Diagnosis not present

## 2017-06-19 DIAGNOSIS — M5441 Lumbago with sciatica, right side: Secondary | ICD-10-CM | POA: Diagnosis not present

## 2017-06-19 DIAGNOSIS — M546 Pain in thoracic spine: Secondary | ICD-10-CM | POA: Diagnosis not present

## 2017-07-11 DIAGNOSIS — M19011 Primary osteoarthritis, right shoulder: Secondary | ICD-10-CM | POA: Diagnosis not present

## 2017-07-11 DIAGNOSIS — M25511 Pain in right shoulder: Secondary | ICD-10-CM | POA: Diagnosis not present

## 2017-07-12 DIAGNOSIS — M546 Pain in thoracic spine: Secondary | ICD-10-CM | POA: Diagnosis not present

## 2017-07-12 DIAGNOSIS — M5441 Lumbago with sciatica, right side: Secondary | ICD-10-CM | POA: Diagnosis not present

## 2017-07-12 DIAGNOSIS — M25519 Pain in unspecified shoulder: Secondary | ICD-10-CM | POA: Diagnosis not present

## 2017-07-12 DIAGNOSIS — M545 Low back pain: Secondary | ICD-10-CM | POA: Diagnosis not present

## 2017-07-18 DIAGNOSIS — M5441 Lumbago with sciatica, right side: Secondary | ICD-10-CM | POA: Diagnosis not present

## 2017-07-18 DIAGNOSIS — M546 Pain in thoracic spine: Secondary | ICD-10-CM | POA: Diagnosis not present

## 2017-07-18 DIAGNOSIS — M545 Low back pain: Secondary | ICD-10-CM | POA: Diagnosis not present

## 2017-07-18 DIAGNOSIS — M25519 Pain in unspecified shoulder: Secondary | ICD-10-CM | POA: Diagnosis not present

## 2017-07-25 DIAGNOSIS — M545 Low back pain: Secondary | ICD-10-CM | POA: Diagnosis not present

## 2017-07-25 DIAGNOSIS — M5441 Lumbago with sciatica, right side: Secondary | ICD-10-CM | POA: Diagnosis not present

## 2017-07-25 DIAGNOSIS — M25519 Pain in unspecified shoulder: Secondary | ICD-10-CM | POA: Diagnosis not present

## 2017-07-25 DIAGNOSIS — M546 Pain in thoracic spine: Secondary | ICD-10-CM | POA: Diagnosis not present

## 2017-08-01 DIAGNOSIS — M545 Low back pain: Secondary | ICD-10-CM | POA: Diagnosis not present

## 2017-08-01 DIAGNOSIS — M25519 Pain in unspecified shoulder: Secondary | ICD-10-CM | POA: Diagnosis not present

## 2017-08-01 DIAGNOSIS — M5441 Lumbago with sciatica, right side: Secondary | ICD-10-CM | POA: Diagnosis not present

## 2017-08-01 DIAGNOSIS — M546 Pain in thoracic spine: Secondary | ICD-10-CM | POA: Diagnosis not present

## 2017-08-08 DIAGNOSIS — M19011 Primary osteoarthritis, right shoulder: Secondary | ICD-10-CM | POA: Diagnosis not present

## 2017-08-08 DIAGNOSIS — M25511 Pain in right shoulder: Secondary | ICD-10-CM | POA: Diagnosis not present

## 2017-08-10 DIAGNOSIS — M545 Low back pain: Secondary | ICD-10-CM | POA: Diagnosis not present

## 2017-08-10 DIAGNOSIS — M25519 Pain in unspecified shoulder: Secondary | ICD-10-CM | POA: Diagnosis not present

## 2017-08-10 DIAGNOSIS — M546 Pain in thoracic spine: Secondary | ICD-10-CM | POA: Diagnosis not present

## 2017-08-10 DIAGNOSIS — M5441 Lumbago with sciatica, right side: Secondary | ICD-10-CM | POA: Diagnosis not present

## 2017-09-01 DIAGNOSIS — R413 Other amnesia: Secondary | ICD-10-CM | POA: Diagnosis not present

## 2017-09-01 DIAGNOSIS — E785 Hyperlipidemia, unspecified: Secondary | ICD-10-CM | POA: Diagnosis not present

## 2017-09-01 DIAGNOSIS — F3341 Major depressive disorder, recurrent, in partial remission: Secondary | ICD-10-CM | POA: Diagnosis not present

## 2017-09-01 DIAGNOSIS — F410 Panic disorder [episodic paroxysmal anxiety] without agoraphobia: Secondary | ICD-10-CM | POA: Diagnosis not present

## 2017-09-01 DIAGNOSIS — R5382 Chronic fatigue, unspecified: Secondary | ICD-10-CM | POA: Diagnosis not present

## 2017-09-14 DIAGNOSIS — N3281 Overactive bladder: Secondary | ICD-10-CM | POA: Diagnosis not present

## 2017-09-14 DIAGNOSIS — E785 Hyperlipidemia, unspecified: Secondary | ICD-10-CM | POA: Diagnosis not present

## 2017-09-14 DIAGNOSIS — R413 Other amnesia: Secondary | ICD-10-CM | POA: Diagnosis not present

## 2017-09-14 DIAGNOSIS — Z6822 Body mass index (BMI) 22.0-22.9, adult: Secondary | ICD-10-CM | POA: Diagnosis not present

## 2017-09-14 DIAGNOSIS — R739 Hyperglycemia, unspecified: Secondary | ICD-10-CM | POA: Diagnosis not present

## 2017-09-14 DIAGNOSIS — M159 Polyosteoarthritis, unspecified: Secondary | ICD-10-CM | POA: Diagnosis not present

## 2017-09-14 DIAGNOSIS — E559 Vitamin D deficiency, unspecified: Secondary | ICD-10-CM | POA: Diagnosis not present

## 2017-09-14 DIAGNOSIS — J309 Allergic rhinitis, unspecified: Secondary | ICD-10-CM | POA: Diagnosis not present

## 2017-10-02 DIAGNOSIS — H68003 Unspecified Eustachian salpingitis, bilateral: Secondary | ICD-10-CM | POA: Diagnosis not present

## 2017-10-02 DIAGNOSIS — R42 Dizziness and giddiness: Secondary | ICD-10-CM | POA: Diagnosis not present

## 2017-10-02 DIAGNOSIS — J309 Allergic rhinitis, unspecified: Secondary | ICD-10-CM | POA: Diagnosis not present

## 2017-10-02 DIAGNOSIS — Z6822 Body mass index (BMI) 22.0-22.9, adult: Secondary | ICD-10-CM | POA: Diagnosis not present

## 2017-10-06 DIAGNOSIS — M25511 Pain in right shoulder: Secondary | ICD-10-CM | POA: Diagnosis not present

## 2017-10-06 DIAGNOSIS — M19011 Primary osteoarthritis, right shoulder: Secondary | ICD-10-CM | POA: Diagnosis not present

## 2017-10-26 DIAGNOSIS — Z6822 Body mass index (BMI) 22.0-22.9, adult: Secondary | ICD-10-CM | POA: Diagnosis not present

## 2017-10-26 DIAGNOSIS — J309 Allergic rhinitis, unspecified: Secondary | ICD-10-CM | POA: Diagnosis not present

## 2017-10-26 DIAGNOSIS — N3281 Overactive bladder: Secondary | ICD-10-CM | POA: Diagnosis not present

## 2017-10-26 DIAGNOSIS — H8109 Meniere's disease, unspecified ear: Secondary | ICD-10-CM | POA: Diagnosis not present

## 2017-10-26 DIAGNOSIS — Z1339 Encounter for screening examination for other mental health and behavioral disorders: Secondary | ICD-10-CM | POA: Diagnosis not present

## 2017-11-03 DIAGNOSIS — R42 Dizziness and giddiness: Secondary | ICD-10-CM | POA: Diagnosis not present

## 2017-11-13 DIAGNOSIS — E785 Hyperlipidemia, unspecified: Secondary | ICD-10-CM | POA: Diagnosis not present

## 2017-11-13 DIAGNOSIS — Z6822 Body mass index (BMI) 22.0-22.9, adult: Secondary | ICD-10-CM | POA: Diagnosis not present

## 2017-11-13 DIAGNOSIS — N3281 Overactive bladder: Secondary | ICD-10-CM | POA: Diagnosis not present

## 2017-11-13 DIAGNOSIS — R413 Other amnesia: Secondary | ICD-10-CM | POA: Diagnosis not present

## 2017-11-22 DIAGNOSIS — F419 Anxiety disorder, unspecified: Secondary | ICD-10-CM | POA: Diagnosis not present

## 2017-11-22 DIAGNOSIS — R197 Diarrhea, unspecified: Secondary | ICD-10-CM | POA: Diagnosis not present

## 2017-11-22 DIAGNOSIS — R112 Nausea with vomiting, unspecified: Secondary | ICD-10-CM | POA: Diagnosis not present

## 2017-11-22 DIAGNOSIS — Z6822 Body mass index (BMI) 22.0-22.9, adult: Secondary | ICD-10-CM | POA: Diagnosis not present

## 2017-11-28 ENCOUNTER — Encounter: Payer: Self-pay | Admitting: Gastroenterology

## 2017-12-04 DIAGNOSIS — Z6822 Body mass index (BMI) 22.0-22.9, adult: Secondary | ICD-10-CM | POA: Diagnosis not present

## 2017-12-04 DIAGNOSIS — F419 Anxiety disorder, unspecified: Secondary | ICD-10-CM | POA: Diagnosis not present

## 2017-12-04 DIAGNOSIS — R42 Dizziness and giddiness: Secondary | ICD-10-CM | POA: Diagnosis not present

## 2017-12-04 DIAGNOSIS — R413 Other amnesia: Secondary | ICD-10-CM | POA: Diagnosis not present

## 2017-12-06 DIAGNOSIS — Z23 Encounter for immunization: Secondary | ICD-10-CM | POA: Diagnosis not present

## 2017-12-11 DIAGNOSIS — R2681 Unsteadiness on feet: Secondary | ICD-10-CM | POA: Diagnosis not present

## 2017-12-11 DIAGNOSIS — M542 Cervicalgia: Secondary | ICD-10-CM | POA: Diagnosis not present

## 2017-12-11 DIAGNOSIS — H8110 Benign paroxysmal vertigo, unspecified ear: Secondary | ICD-10-CM | POA: Diagnosis not present

## 2017-12-15 ENCOUNTER — Encounter: Payer: Self-pay | Admitting: Gastroenterology

## 2017-12-26 DIAGNOSIS — M4126 Other idiopathic scoliosis, lumbar region: Secondary | ICD-10-CM | POA: Diagnosis not present

## 2017-12-26 DIAGNOSIS — M4322 Fusion of spine, cervical region: Secondary | ICD-10-CM | POA: Diagnosis not present

## 2017-12-26 DIAGNOSIS — S336XXA Sprain of sacroiliac joint, initial encounter: Secondary | ICD-10-CM | POA: Diagnosis not present

## 2017-12-26 DIAGNOSIS — M9905 Segmental and somatic dysfunction of pelvic region: Secondary | ICD-10-CM | POA: Diagnosis not present

## 2017-12-26 DIAGNOSIS — M9902 Segmental and somatic dysfunction of thoracic region: Secondary | ICD-10-CM | POA: Diagnosis not present

## 2017-12-26 DIAGNOSIS — M9901 Segmental and somatic dysfunction of cervical region: Secondary | ICD-10-CM | POA: Diagnosis not present

## 2017-12-26 DIAGNOSIS — M5383 Other specified dorsopathies, cervicothoracic region: Secondary | ICD-10-CM | POA: Diagnosis not present

## 2017-12-26 DIAGNOSIS — M9903 Segmental and somatic dysfunction of lumbar region: Secondary | ICD-10-CM | POA: Diagnosis not present

## 2017-12-26 DIAGNOSIS — M50321 Other cervical disc degeneration at C4-C5 level: Secondary | ICD-10-CM | POA: Diagnosis not present

## 2017-12-27 DIAGNOSIS — M50321 Other cervical disc degeneration at C4-C5 level: Secondary | ICD-10-CM | POA: Diagnosis not present

## 2017-12-27 DIAGNOSIS — M5383 Other specified dorsopathies, cervicothoracic region: Secondary | ICD-10-CM | POA: Diagnosis not present

## 2017-12-27 DIAGNOSIS — S336XXA Sprain of sacroiliac joint, initial encounter: Secondary | ICD-10-CM | POA: Diagnosis not present

## 2017-12-27 DIAGNOSIS — M9903 Segmental and somatic dysfunction of lumbar region: Secondary | ICD-10-CM | POA: Diagnosis not present

## 2017-12-27 DIAGNOSIS — M4126 Other idiopathic scoliosis, lumbar region: Secondary | ICD-10-CM | POA: Diagnosis not present

## 2017-12-27 DIAGNOSIS — M9905 Segmental and somatic dysfunction of pelvic region: Secondary | ICD-10-CM | POA: Diagnosis not present

## 2017-12-27 DIAGNOSIS — M9901 Segmental and somatic dysfunction of cervical region: Secondary | ICD-10-CM | POA: Diagnosis not present

## 2017-12-27 DIAGNOSIS — M4322 Fusion of spine, cervical region: Secondary | ICD-10-CM | POA: Diagnosis not present

## 2017-12-27 DIAGNOSIS — M9902 Segmental and somatic dysfunction of thoracic region: Secondary | ICD-10-CM | POA: Diagnosis not present

## 2017-12-29 ENCOUNTER — Ambulatory Visit (INDEPENDENT_AMBULATORY_CARE_PROVIDER_SITE_OTHER): Payer: Medicare Other | Admitting: Gastroenterology

## 2017-12-29 ENCOUNTER — Encounter: Payer: Self-pay | Admitting: Gastroenterology

## 2017-12-29 VITALS — BP 106/62 | HR 64 | Ht 61.5 in | Wt 122.2 lb

## 2017-12-29 DIAGNOSIS — R1319 Other dysphagia: Secondary | ICD-10-CM

## 2017-12-29 DIAGNOSIS — K219 Gastro-esophageal reflux disease without esophagitis: Secondary | ICD-10-CM | POA: Diagnosis not present

## 2017-12-29 DIAGNOSIS — R1310 Dysphagia, unspecified: Secondary | ICD-10-CM | POA: Diagnosis not present

## 2017-12-29 NOTE — Patient Instructions (Addendum)
If you are age 74 or older, your body mass index should be between 23-30. Your Body mass index is 22.72 kg/m. If this is out of the aforementioned range listed, please consider follow up with your Primary Care Provider.  If you are age 64 or younger, your body mass index should be between 19-25. Your Body mass index is 22.72 kg/m. If this is out of the aformentioned range listed, please consider follow up with your Primary Care Provider.   You have been scheduled for an endoscopy. Please follow written instructions given to you at your visit today. If you use inhalers (even only as needed), please bring them with you on the day of your procedure. Your physician has requested that you go to www.startemmi.com and enter the access code given to you at your visit today. This web site gives a general overview about your procedure. However, you should still follow specific instructions given to you by our office regarding your preparation for the procedure.    You have been scheduled for a Barium Esophogram at [facility] (1st floor of the hospital) on 01/10/18 at 11am. Please arrive 15 minutes prior to your appointment for registration. Make certain not to have anything to eat or drink 3 hours prior to your test. If you need to reschedule for any reason, please contact radiology at [phone] to do so. __________________________________________________________________ A barium swallow is an examination that concentrates on views of the esophagus. This tends to be a double contrast exam (barium and two liquids which, when combined, create a gas to distend the wall of the oesophagus) or single contrast (non-ionic iodine based). The study is usually tailored to your symptoms so a good history is essential. Attention is paid during the study to the form, structure and configuration of the esophagus, looking for functional disorders (such as aspiration, dysphagia, achalasia, motility and  reflux) EXAMINATION You may be asked to change into a gown, depending on the type of swallow being performed. A radiologist and radiographer will perform the procedure. The radiologist will advise you of the type of contrast selected for your procedure and direct you during the exam. You will be asked to stand, sit or lie in several different positions and to hold a small amount of fluid in your mouth before being asked to swallow while the imaging is performed .In some instances you may be asked to swallow barium coated marshmallows to assess the motility of a solid food bolus. The exam can be recorded as a digital or video fluoroscopy procedure. POST PROCEDURE It will take 1-2 days for the barium to pass through your system. To facilitate this, it is important, unless otherwise directed, to increase your fluids for the next 24-48hrs and to resume your normal diet.  This test typically takes about 30 minutes to perform. __________________________________________________________________________________  Thank you,  Dr. Jackquline Denmark

## 2017-12-29 NOTE — Progress Notes (Signed)
Chief Complaint: Dysphagia  Referring Provider:  Nicholos Johns, MD      ASSESSMENT AND PLAN;   #1. Esophageal dysphagia (Differential diagnoses includes esophageal stricture, Schatzki's ring, motility disorder, eosinophilic esophagitis, pill induced esophagitis, rule out esophageal carcinoma or extrinsic lesions) Plan: - Omeprazole 20 mg po qd to continue. - Ba swallow with tablet. - EGD with dilatation.  Discussed risks and benefits with the patient and patient's husband. - I have instructed patient that she needs to chew foods especially meats and breads well and eat slowly. #2. GERD with small HH s/p EGD with dil to 48 Fr Maloney (Schatzki's ring 02/2010). Neg Bx for EoE with good relief.   HPI:    Sheryl Ball is a 72 y.o. female  With 3 to 75-month history of dysphagia mostly to solids-like chicken and corn bread.  Had intermittent dysphagia for many years, status post esophageal dilatation x2 Intermittent Getting slightly worse Gets stuck in the mid chest but resolved spontaneously No problems with pills No nausea, vomiting, heartburn, regurgitation, melena Constipation is much better Denies having any abdominal pain. No significant weight loss. Accompanied by her husband  Past GI procedures: -EGD with esophageal dilatation 02/2010: Schatzki's ring status post esophageal dilatation 47 F4 Maloney, small hiatal hernia, negative biopsies for EoE. -Screening colonoscopy 10/2012: Mild sigmoid diverticulosis, highly redundant colon, internal hemorrhoids.  Otherwise normal to TI.  Repeat in 10 years.  Earlier, if any problems   Past Medical History:  Diagnosis Date  . Anxiety   . Arthritis   . Depression   . GERD (gastroesophageal reflux disease)   . Hypercholesteremia   . IBS (irritable bowel syndrome)   . Mitral valve prolapse   . Osteoarthritis   . Panic disorder     Past Surgical History:  Procedure Laterality Date  . ABDOMINAL HYSTERECTOMY     30 years ago  .  BACK SURGERY     C5-C7 fusion  . BLADDER SURGERY     bladder lifted twice  . COLONOSCOPY  11/09/2012   Moderate internal hemorrhoids. Mild sigmoid diverticulosis. THe colon was highly redundant.   . COLONOSCOPY WITH ESOPHAGOGASTRODUODENOSCOPY (EGD)    . ESOPHAGOGASTRODUODENOSCOPY  03/10/2010   Schatzkis ring status post dilitation. Small hiatal hernia.   Marland Kitchen HERNIA REPAIR  1998    Family History  Problem Relation Age of Onset  . Colon cancer Neg Hx   . Esophageal cancer Neg Hx     Social History   Tobacco Use  . Smoking status: Never Smoker  . Smokeless tobacco: Never Used  Substance Use Topics  . Alcohol use: Never    Frequency: Never  . Drug use: Never    Current Outpatient Medications  Medication Sig Dispense Refill  . AMBULATORY NON FORMULARY MEDICATION Calcium 1 tablet daily    . aspirin EC 81 MG tablet Take 81 mg by mouth daily.    . busPIRone (BUSPAR) 10 MG tablet Take 10 mg by mouth daily.    . Coenzyme Q10 (COQ-10) 200 MG CAPS Take 1 tablet by mouth daily.    . DONEPEZIL HCL PO Take 1 tablet by mouth daily.    Marland Kitchen gabapentin (NEURONTIN) 300 MG capsule Take 300 mg by mouth 2 (two) times daily.    . Magnesium 250 MG TABS Take 1 tablet by mouth daily.    . memantine (NAMENDA) 10 MG tablet Take 10 mg by mouth daily.    Marland Kitchen MONTELUKAST SODIUM PO Take 1 tablet by mouth daily.    Marland Kitchen  Omega-3 Fatty Acids (OMEGA-3 1450 PO) Take 1 tablet by mouth daily.    . Omeprazole (PRILOSEC PO) Take 1 tablet by mouth daily.    Marland Kitchen oxybutynin (DITROPAN-XL) 10 MG 24 hr tablet Take 10 mg by mouth daily.    Marland Kitchen doxycycline (PERIOSTAT) 20 MG tablet Take 20 mg by mouth 2 (two) times daily with a meal.  6  . DULoxetine (CYMBALTA) 60 MG capsule Take 2 capsules by mouth daily.  12  . meclizine (ANTIVERT) 12.5 MG tablet Take 1 tablet by mouth daily.  0  . pravastatin (PRAVACHOL) 80 MG tablet Take 80 mg by mouth daily.  0  . traZODone (DESYREL) 50 MG tablet Take 125 mg by mouth daily. Take 2.5 tablets  everyday  0   No current facility-administered medications for this visit.     Allergies  Allergen Reactions  . Morphine Sulfate Nausea And Vomiting  . Prednisone     Review of Systems:  Constitutional: Denies fever, chills, diaphoresis, appetite change and fatigue. Has sleeping problems HEENT: Denies photophobia, eye pain, redness, hearing loss, ear pain, congestion, sore throat, rhinorrhea, sneezing, mouth sores, neck pain, neck stiffness and tinnitus.   Respiratory: Denies SOB, DOE, cough, chest tightness,  and wheezing.   Cardiovascular: Denies chest pain, palpitations and leg swelling.  Genitourinary: Denies dysuria, urgency, frequency, hematuria, flank pain and difficulty urinating.  Musculoskeletal: Denies myalgias,  joint swelling, arthralgias and gait problem.  Has back pain Neurological: Denies dizziness, seizures, syncope, weakness, light-headedness, numbness and headaches.  Hematological: Denies adenopathy. Easy bruising, personal or family bleeding history  Psychiatric/Behavioral: Has anxiety or depression     Physical Exam:    BP 106/62   Pulse 64   Ht 5' 1.5" (1.562 m)   Wt 122 lb 4 oz (55.5 kg)   BMI 22.72 kg/m  Filed Weights   12/29/17 1017  Weight: 122 lb 4 oz (55.5 kg)   Constitutional:  Well-developed, in no acute distress. Psychiatric: Normal mood and affect. Behavior is normal. HEENT: Pupils normal.  Conjunctivae are normal. No scleral icterus. Neck supple.  Cardiovascular: Normal rate, regular rhythm. No edema Pulmonary/chest: Effort normal and breath sounds normal. No wheezing, rales or rhonchi. Abdominal: Soft, nondistended. Nontender. Bowel sounds active throughout. There are no masses palpable. No hepatomegaly. Rectal:  defered Neurological: Alert and oriented to person place and time. Skin: Skin is warm and dry. No rashes noted.    Carmell Austria, MD 12/29/2017, 10:44 AM  Cc: Nicholos Johns, MD

## 2018-01-01 DIAGNOSIS — M4322 Fusion of spine, cervical region: Secondary | ICD-10-CM | POA: Diagnosis not present

## 2018-01-01 DIAGNOSIS — S336XXA Sprain of sacroiliac joint, initial encounter: Secondary | ICD-10-CM | POA: Diagnosis not present

## 2018-01-01 DIAGNOSIS — M9902 Segmental and somatic dysfunction of thoracic region: Secondary | ICD-10-CM | POA: Diagnosis not present

## 2018-01-01 DIAGNOSIS — M9901 Segmental and somatic dysfunction of cervical region: Secondary | ICD-10-CM | POA: Diagnosis not present

## 2018-01-01 DIAGNOSIS — M9903 Segmental and somatic dysfunction of lumbar region: Secondary | ICD-10-CM | POA: Diagnosis not present

## 2018-01-01 DIAGNOSIS — M50321 Other cervical disc degeneration at C4-C5 level: Secondary | ICD-10-CM | POA: Diagnosis not present

## 2018-01-01 DIAGNOSIS — M9905 Segmental and somatic dysfunction of pelvic region: Secondary | ICD-10-CM | POA: Diagnosis not present

## 2018-01-01 DIAGNOSIS — M5383 Other specified dorsopathies, cervicothoracic region: Secondary | ICD-10-CM | POA: Diagnosis not present

## 2018-01-01 DIAGNOSIS — M4126 Other idiopathic scoliosis, lumbar region: Secondary | ICD-10-CM | POA: Diagnosis not present

## 2018-01-03 DIAGNOSIS — M4126 Other idiopathic scoliosis, lumbar region: Secondary | ICD-10-CM | POA: Diagnosis not present

## 2018-01-03 DIAGNOSIS — M5383 Other specified dorsopathies, cervicothoracic region: Secondary | ICD-10-CM | POA: Diagnosis not present

## 2018-01-03 DIAGNOSIS — M4322 Fusion of spine, cervical region: Secondary | ICD-10-CM | POA: Diagnosis not present

## 2018-01-03 DIAGNOSIS — M9903 Segmental and somatic dysfunction of lumbar region: Secondary | ICD-10-CM | POA: Diagnosis not present

## 2018-01-03 DIAGNOSIS — M9905 Segmental and somatic dysfunction of pelvic region: Secondary | ICD-10-CM | POA: Diagnosis not present

## 2018-01-03 DIAGNOSIS — M9902 Segmental and somatic dysfunction of thoracic region: Secondary | ICD-10-CM | POA: Diagnosis not present

## 2018-01-03 DIAGNOSIS — S336XXA Sprain of sacroiliac joint, initial encounter: Secondary | ICD-10-CM | POA: Diagnosis not present

## 2018-01-03 DIAGNOSIS — M9901 Segmental and somatic dysfunction of cervical region: Secondary | ICD-10-CM | POA: Diagnosis not present

## 2018-01-03 DIAGNOSIS — M50321 Other cervical disc degeneration at C4-C5 level: Secondary | ICD-10-CM | POA: Diagnosis not present

## 2018-01-05 DIAGNOSIS — M9902 Segmental and somatic dysfunction of thoracic region: Secondary | ICD-10-CM | POA: Diagnosis not present

## 2018-01-05 DIAGNOSIS — M4322 Fusion of spine, cervical region: Secondary | ICD-10-CM | POA: Diagnosis not present

## 2018-01-05 DIAGNOSIS — M50321 Other cervical disc degeneration at C4-C5 level: Secondary | ICD-10-CM | POA: Diagnosis not present

## 2018-01-05 DIAGNOSIS — M4126 Other idiopathic scoliosis, lumbar region: Secondary | ICD-10-CM | POA: Diagnosis not present

## 2018-01-05 DIAGNOSIS — S336XXA Sprain of sacroiliac joint, initial encounter: Secondary | ICD-10-CM | POA: Diagnosis not present

## 2018-01-05 DIAGNOSIS — M9901 Segmental and somatic dysfunction of cervical region: Secondary | ICD-10-CM | POA: Diagnosis not present

## 2018-01-05 DIAGNOSIS — M9905 Segmental and somatic dysfunction of pelvic region: Secondary | ICD-10-CM | POA: Diagnosis not present

## 2018-01-05 DIAGNOSIS — M5383 Other specified dorsopathies, cervicothoracic region: Secondary | ICD-10-CM | POA: Diagnosis not present

## 2018-01-05 DIAGNOSIS — M9903 Segmental and somatic dysfunction of lumbar region: Secondary | ICD-10-CM | POA: Diagnosis not present

## 2018-01-09 DIAGNOSIS — M5383 Other specified dorsopathies, cervicothoracic region: Secondary | ICD-10-CM | POA: Diagnosis not present

## 2018-01-09 DIAGNOSIS — M9902 Segmental and somatic dysfunction of thoracic region: Secondary | ICD-10-CM | POA: Diagnosis not present

## 2018-01-09 DIAGNOSIS — S336XXA Sprain of sacroiliac joint, initial encounter: Secondary | ICD-10-CM | POA: Diagnosis not present

## 2018-01-09 DIAGNOSIS — M9901 Segmental and somatic dysfunction of cervical region: Secondary | ICD-10-CM | POA: Diagnosis not present

## 2018-01-09 DIAGNOSIS — M4322 Fusion of spine, cervical region: Secondary | ICD-10-CM | POA: Diagnosis not present

## 2018-01-09 DIAGNOSIS — M9905 Segmental and somatic dysfunction of pelvic region: Secondary | ICD-10-CM | POA: Diagnosis not present

## 2018-01-09 DIAGNOSIS — M50321 Other cervical disc degeneration at C4-C5 level: Secondary | ICD-10-CM | POA: Diagnosis not present

## 2018-01-09 DIAGNOSIS — M4126 Other idiopathic scoliosis, lumbar region: Secondary | ICD-10-CM | POA: Diagnosis not present

## 2018-01-09 DIAGNOSIS — M9903 Segmental and somatic dysfunction of lumbar region: Secondary | ICD-10-CM | POA: Diagnosis not present

## 2018-01-10 ENCOUNTER — Ambulatory Visit (HOSPITAL_COMMUNITY)
Admission: RE | Admit: 2018-01-10 | Discharge: 2018-01-10 | Disposition: A | Discharge status: Home / Self Care | Payer: Medicare Other | Source: Ambulatory Visit | Attending: Gastroenterology | Admitting: Gastroenterology

## 2018-01-10 DIAGNOSIS — K449 Diaphragmatic hernia without obstruction or gangrene: Secondary | ICD-10-CM | POA: Insufficient documentation

## 2018-01-10 DIAGNOSIS — R1310 Dysphagia, unspecified: Secondary | ICD-10-CM | POA: Diagnosis not present

## 2018-01-10 DIAGNOSIS — R1319 Other dysphagia: Secondary | ICD-10-CM

## 2018-01-10 DIAGNOSIS — K219 Gastro-esophageal reflux disease without esophagitis: Secondary | ICD-10-CM | POA: Insufficient documentation

## 2018-01-11 DIAGNOSIS — M50321 Other cervical disc degeneration at C4-C5 level: Secondary | ICD-10-CM | POA: Diagnosis not present

## 2018-01-11 DIAGNOSIS — M5383 Other specified dorsopathies, cervicothoracic region: Secondary | ICD-10-CM | POA: Diagnosis not present

## 2018-01-11 DIAGNOSIS — M9903 Segmental and somatic dysfunction of lumbar region: Secondary | ICD-10-CM | POA: Diagnosis not present

## 2018-01-11 DIAGNOSIS — M9902 Segmental and somatic dysfunction of thoracic region: Secondary | ICD-10-CM | POA: Diagnosis not present

## 2018-01-11 DIAGNOSIS — M4322 Fusion of spine, cervical region: Secondary | ICD-10-CM | POA: Diagnosis not present

## 2018-01-11 DIAGNOSIS — S336XXA Sprain of sacroiliac joint, initial encounter: Secondary | ICD-10-CM | POA: Diagnosis not present

## 2018-01-11 DIAGNOSIS — M4126 Other idiopathic scoliosis, lumbar region: Secondary | ICD-10-CM | POA: Diagnosis not present

## 2018-01-11 DIAGNOSIS — M9901 Segmental and somatic dysfunction of cervical region: Secondary | ICD-10-CM | POA: Diagnosis not present

## 2018-01-11 DIAGNOSIS — M9905 Segmental and somatic dysfunction of pelvic region: Secondary | ICD-10-CM | POA: Diagnosis not present

## 2018-01-12 ENCOUNTER — Ambulatory Visit (AMBULATORY_SURGERY_CENTER): Payer: Medicare Other | Admitting: Gastroenterology

## 2018-01-12 ENCOUNTER — Encounter: Payer: Self-pay | Admitting: Gastroenterology

## 2018-01-12 VITALS — BP 108/60 | HR 56 | Temp 98.6°F | Resp 8 | Ht 61.0 in | Wt 122.0 lb

## 2018-01-12 DIAGNOSIS — R1310 Dysphagia, unspecified: Secondary | ICD-10-CM

## 2018-01-12 DIAGNOSIS — K222 Esophageal obstruction: Secondary | ICD-10-CM

## 2018-01-12 DIAGNOSIS — K259 Gastric ulcer, unspecified as acute or chronic, without hemorrhage or perforation: Secondary | ICD-10-CM | POA: Diagnosis not present

## 2018-01-12 DIAGNOSIS — R42 Dizziness and giddiness: Secondary | ICD-10-CM | POA: Diagnosis not present

## 2018-01-12 DIAGNOSIS — K219 Gastro-esophageal reflux disease without esophagitis: Secondary | ICD-10-CM | POA: Diagnosis not present

## 2018-01-12 DIAGNOSIS — F329 Major depressive disorder, single episode, unspecified: Secondary | ICD-10-CM | POA: Diagnosis not present

## 2018-01-12 DIAGNOSIS — F419 Anxiety disorder, unspecified: Secondary | ICD-10-CM | POA: Diagnosis not present

## 2018-01-12 MED ORDER — SODIUM CHLORIDE 0.9 % IV SOLN: 500.0000 mL | Freq: Once | INTRAVENOUS | Status: DC

## 2018-01-12 MED ORDER — PANTOPRAZOLE SODIUM 40 MG PO TBEC: 40.0000 mg | DELAYED_RELEASE_TABLET | Freq: Every day | ORAL | 3 refills | Status: AC

## 2018-01-12 NOTE — Patient Instructions (Signed)
HANDOUTS GIVEN FOR GERD, HIATAL HERNIA, STRICTURE AND POST DILATION DIET  YOU HAD AN ENDOSCOPIC PROCEDURE TODAY AT [facility]:   Refer to the procedure report that was given to you for any specific questions about what was found during the examination.  If the procedure report does not answer your questions, please call your gastroenterologist to clarify.  If you requested that your care partner not be given the details of your procedure findings, then the procedure report has been included in a sealed envelope for you to review at your convenience later.  YOU SHOULD EXPECT: Some feelings of bloating in the abdomen. Passage of more gas than usual.  Walking can help get rid of the air that was put into your GI tract during the procedure and reduce the bloating. If you had a lower endoscopy (such as a colonoscopy or flexible sigmoidoscopy) you may notice spotting of blood in your stool or on the toilet paper. If you underwent a bowel prep for your procedure, you may not have a normal bowel movement for a few days.  Please Note:  You might notice some irritation and congestion in your nose or some drainage.  This is from the oxygen used during your procedure.  There is no need for concern and it should clear up in a day or so.  SYMPTOMS TO REPORT IMMEDIATELY:    Following upper endoscopy (EGD)  Vomiting of blood or coffee ground material  New chest pain or pain under the shoulder blades  Painful or persistently difficult swallowing  New shortness of breath  Fever of 100F or higher  Black, tarry-looking stools  For urgent or emergent issues, a gastroenterologist can be reached at any hour by calling [phone].   DIET:  We do recommend a small meal at first, but then you may proceed to your regular diet.  Drink plenty of fluids but you should avoid alcoholic beverages for 24 hours.  ACTIVITY:  You should plan to take it easy for the rest of today and you should NOT DRIVE  or use heavy machinery until tomorrow (because of the sedation medicines used during the test).    FOLLOW UP: Our staff will call the number listed on your records the next business day following your procedure to check on you and address any questions or concerns that you may have regarding the information given to you following your procedure. If we do not reach you, we will leave a message.  However, if you are feeling well and you are not experiencing any problems, there is no need to return our call.  We will assume that you have returned to your regular daily activities without incident.  If any biopsies were taken you will be contacted by phone or by letter within the next 1-3 weeks.  Please call us at [phone] if you have not heard about the biopsies in 3 weeks.    SIGNATURES/CONFIDENTIALITY: You and/or your care partner have signed paperwork which will be entered into your electronic medical record.  These signatures attest to the fact that that the information above on your After Visit Summary has been reviewed and is understood.  Full responsibility of the confidentiality of this discharge information lies with you and/or your care-partner.

## 2018-01-12 NOTE — Progress Notes (Signed)
Pt's states no medical or surgical changes since previsit or office visit. 

## 2018-01-12 NOTE — Progress Notes (Signed)
Report to PACU, RN, vss, BBS= Clear.  

## 2018-01-12 NOTE — Op Note (Signed)
[place] Patient Name: Sheryl Ball Procedure Date: 01/12/2018 8:53 AM MRN: [id number] Endoscopist: Jackquline Denmark , MD Age: 72 Referring MD:  Date of Birth: 01-28-1946 Gender: Female Account #: [id number] Procedure:                Upper GI endoscopy Indications:              Dysphagia- barium swallow showing Schatzki's ring                            and a small hiatal hernia. Medicines:                Monitored Anesthesia Care Procedure:                Pre-Anesthesia Assessment:                           - Prior to the procedure, a History and Physical                            was performed, and patient medications and                            allergies were reviewed. The patient's tolerance of                            previous anesthesia was also reviewed. The risks                            and benefits of the procedure and the sedation                            options and risks were discussed with the patient.                            All questions were answered, and informed consent                            was obtained. Prior Anticoagulants: The patient has                            taken no previous anticoagulant or antiplatelet                            agents. ASA Grade Assessment: II - A patient with                            mild systemic disease. After reviewing the risks                            and benefits, the patient was deemed in                            satisfactory condition to undergo the procedure.  After obtaining informed consent, the endoscope was                            passed under direct vision. Throughout the                            procedure, the patient's blood pressure, pulse, and                            oxygen saturations were monitored continuously. The                            Endoscope was introduced through the mouth, and                            advanced to the second part of  duodenum. The upper                            GI endoscopy was accomplished without difficulty.                            The patient tolerated the procedure well. Scope In: Scope Out: Findings:                 Schatzki's ring with mild stenosis was found 36 cm                            from the incisors. This stenosis measured 1.4 cm                            (inner diameter). The stenosis was traversed. The                            scope was withdrawn. Dilation was performed with a                            Maloney dilator with mild resistance at 48 Fr                            followed by 50 Fr. Esophageal biopsies were not                            obtained since previous biopsies were negative for                            eosinophilic esophagitis.                           A small transient hiatal hernia was present.                           Localized moderate inflammation characterized by  erosions was found in the gastric antrum. Biopsies                            were taken with a cold forceps for histology.                            Estimated blood loss was minimal.                           The exam was otherwise without abnormality. Complications:            No immediate complications. Estimated Blood Loss:     Estimated blood loss: none. Impression:               - Schatzki's ring status post esophageal dilatation.                           - Small hiatal hernia.                           - Erosive gastritis. Biopsied. Recommendation:           - Patient has a contact number available for                            emergencies. The signs and symptoms of potential                            delayed complications were discussed with the                            patient. Return to normal activities tomorrow.                            Written discharge instructions were provided to the                            patient.                            - Postdilatation diet.                           - Change omeprazole to Protonix 40 mg by mouth once                            a day.                           - Await pathology results.                           - Avoid ibuprofen, naproxen, or other non-steroidal                            anti-inflammatory drugs.                           -  Return to my office in 12 weeks. Jackquline Denmark, MD 01/12/2018 9:10:40 AM This report has been signed electronically.

## 2018-01-12 NOTE — Progress Notes (Signed)
Called to room to assist during endoscopic procedure.  Patient ID and intended procedure confirmed with present staff. Received instructions for my participation in the procedure from the performing physician.  

## 2018-01-15 ENCOUNTER — Telehealth: Payer: Self-pay

## 2018-01-15 DIAGNOSIS — M9903 Segmental and somatic dysfunction of lumbar region: Secondary | ICD-10-CM | POA: Diagnosis not present

## 2018-01-15 DIAGNOSIS — M9901 Segmental and somatic dysfunction of cervical region: Secondary | ICD-10-CM | POA: Diagnosis not present

## 2018-01-15 DIAGNOSIS — M4126 Other idiopathic scoliosis, lumbar region: Secondary | ICD-10-CM | POA: Diagnosis not present

## 2018-01-15 DIAGNOSIS — M4322 Fusion of spine, cervical region: Secondary | ICD-10-CM | POA: Diagnosis not present

## 2018-01-15 DIAGNOSIS — M9902 Segmental and somatic dysfunction of thoracic region: Secondary | ICD-10-CM | POA: Diagnosis not present

## 2018-01-15 DIAGNOSIS — S336XXA Sprain of sacroiliac joint, initial encounter: Secondary | ICD-10-CM | POA: Diagnosis not present

## 2018-01-15 DIAGNOSIS — M5383 Other specified dorsopathies, cervicothoracic region: Secondary | ICD-10-CM | POA: Diagnosis not present

## 2018-01-15 DIAGNOSIS — M9905 Segmental and somatic dysfunction of pelvic region: Secondary | ICD-10-CM | POA: Diagnosis not present

## 2018-01-15 DIAGNOSIS — M50321 Other cervical disc degeneration at C4-C5 level: Secondary | ICD-10-CM | POA: Diagnosis not present

## 2018-01-15 NOTE — Telephone Encounter (Signed)
  Follow up Call-  Call back number 01/12/2018  Post procedure Call Back phone  # [phone] cell  Permission to leave phone message Yes  Some recent data might be hidden     Patient questions:  Do you have a fever, pain , or abdominal swelling? Yes.   Pain Score  0 * Back pain, chronic  Have you tolerated food without any problems? Yes.    Have you been able to return to your normal activities? Yes.    Do you have any questions about your discharge instructions: Diet   No. Medications  No. Follow up visit  No.  Do you have questions or concerns about your Care? No.  Actions: * If pain score is 4 or above: No action needed, pain <4.

## 2018-01-16 ENCOUNTER — Encounter: Payer: Self-pay | Admitting: Gastroenterology

## 2018-01-16 DIAGNOSIS — M50321 Other cervical disc degeneration at C4-C5 level: Secondary | ICD-10-CM | POA: Diagnosis not present

## 2018-01-16 DIAGNOSIS — M4322 Fusion of spine, cervical region: Secondary | ICD-10-CM | POA: Diagnosis not present

## 2018-01-16 DIAGNOSIS — S336XXA Sprain of sacroiliac joint, initial encounter: Secondary | ICD-10-CM | POA: Diagnosis not present

## 2018-01-16 DIAGNOSIS — M9902 Segmental and somatic dysfunction of thoracic region: Secondary | ICD-10-CM | POA: Diagnosis not present

## 2018-01-16 DIAGNOSIS — M9903 Segmental and somatic dysfunction of lumbar region: Secondary | ICD-10-CM | POA: Diagnosis not present

## 2018-01-16 DIAGNOSIS — M9905 Segmental and somatic dysfunction of pelvic region: Secondary | ICD-10-CM | POA: Diagnosis not present

## 2018-01-16 DIAGNOSIS — M9901 Segmental and somatic dysfunction of cervical region: Secondary | ICD-10-CM | POA: Diagnosis not present

## 2018-01-16 DIAGNOSIS — M5383 Other specified dorsopathies, cervicothoracic region: Secondary | ICD-10-CM | POA: Diagnosis not present

## 2018-01-16 DIAGNOSIS — M4126 Other idiopathic scoliosis, lumbar region: Secondary | ICD-10-CM | POA: Diagnosis not present

## 2018-01-19 DIAGNOSIS — S336XXA Sprain of sacroiliac joint, initial encounter: Secondary | ICD-10-CM | POA: Diagnosis not present

## 2018-01-19 DIAGNOSIS — M9901 Segmental and somatic dysfunction of cervical region: Secondary | ICD-10-CM | POA: Diagnosis not present

## 2018-01-19 DIAGNOSIS — M9905 Segmental and somatic dysfunction of pelvic region: Secondary | ICD-10-CM | POA: Diagnosis not present

## 2018-01-19 DIAGNOSIS — M4322 Fusion of spine, cervical region: Secondary | ICD-10-CM | POA: Diagnosis not present

## 2018-01-19 DIAGNOSIS — M9902 Segmental and somatic dysfunction of thoracic region: Secondary | ICD-10-CM | POA: Diagnosis not present

## 2018-01-19 DIAGNOSIS — M50321 Other cervical disc degeneration at C4-C5 level: Secondary | ICD-10-CM | POA: Diagnosis not present

## 2018-01-19 DIAGNOSIS — M5383 Other specified dorsopathies, cervicothoracic region: Secondary | ICD-10-CM | POA: Diagnosis not present

## 2018-01-19 DIAGNOSIS — M4126 Other idiopathic scoliosis, lumbar region: Secondary | ICD-10-CM | POA: Diagnosis not present

## 2018-01-19 DIAGNOSIS — M9903 Segmental and somatic dysfunction of lumbar region: Secondary | ICD-10-CM | POA: Diagnosis not present

## 2018-01-23 DIAGNOSIS — M9905 Segmental and somatic dysfunction of pelvic region: Secondary | ICD-10-CM | POA: Diagnosis not present

## 2018-01-23 DIAGNOSIS — M50321 Other cervical disc degeneration at C4-C5 level: Secondary | ICD-10-CM | POA: Diagnosis not present

## 2018-01-23 DIAGNOSIS — M5383 Other specified dorsopathies, cervicothoracic region: Secondary | ICD-10-CM | POA: Diagnosis not present

## 2018-01-23 DIAGNOSIS — M9901 Segmental and somatic dysfunction of cervical region: Secondary | ICD-10-CM | POA: Diagnosis not present

## 2018-01-23 DIAGNOSIS — M9903 Segmental and somatic dysfunction of lumbar region: Secondary | ICD-10-CM | POA: Diagnosis not present

## 2018-01-23 DIAGNOSIS — S336XXA Sprain of sacroiliac joint, initial encounter: Secondary | ICD-10-CM | POA: Diagnosis not present

## 2018-01-23 DIAGNOSIS — M4126 Other idiopathic scoliosis, lumbar region: Secondary | ICD-10-CM | POA: Diagnosis not present

## 2018-01-23 DIAGNOSIS — M9902 Segmental and somatic dysfunction of thoracic region: Secondary | ICD-10-CM | POA: Diagnosis not present

## 2018-01-23 DIAGNOSIS — M4322 Fusion of spine, cervical region: Secondary | ICD-10-CM | POA: Diagnosis not present

## 2018-01-25 DIAGNOSIS — M9905 Segmental and somatic dysfunction of pelvic region: Secondary | ICD-10-CM | POA: Diagnosis not present

## 2018-01-25 DIAGNOSIS — M9901 Segmental and somatic dysfunction of cervical region: Secondary | ICD-10-CM | POA: Diagnosis not present

## 2018-01-25 DIAGNOSIS — M50321 Other cervical disc degeneration at C4-C5 level: Secondary | ICD-10-CM | POA: Diagnosis not present

## 2018-01-25 DIAGNOSIS — M4322 Fusion of spine, cervical region: Secondary | ICD-10-CM | POA: Diagnosis not present

## 2018-01-25 DIAGNOSIS — M5383 Other specified dorsopathies, cervicothoracic region: Secondary | ICD-10-CM | POA: Diagnosis not present

## 2018-01-25 DIAGNOSIS — M9903 Segmental and somatic dysfunction of lumbar region: Secondary | ICD-10-CM | POA: Diagnosis not present

## 2018-01-25 DIAGNOSIS — S336XXA Sprain of sacroiliac joint, initial encounter: Secondary | ICD-10-CM | POA: Diagnosis not present

## 2018-01-25 DIAGNOSIS — M9902 Segmental and somatic dysfunction of thoracic region: Secondary | ICD-10-CM | POA: Diagnosis not present

## 2018-01-25 DIAGNOSIS — M4126 Other idiopathic scoliosis, lumbar region: Secondary | ICD-10-CM | POA: Diagnosis not present

## 2018-01-30 DIAGNOSIS — M9903 Segmental and somatic dysfunction of lumbar region: Secondary | ICD-10-CM | POA: Diagnosis not present

## 2018-01-30 DIAGNOSIS — M50321 Other cervical disc degeneration at C4-C5 level: Secondary | ICD-10-CM | POA: Diagnosis not present

## 2018-01-30 DIAGNOSIS — M4322 Fusion of spine, cervical region: Secondary | ICD-10-CM | POA: Diagnosis not present

## 2018-01-30 DIAGNOSIS — M9902 Segmental and somatic dysfunction of thoracic region: Secondary | ICD-10-CM | POA: Diagnosis not present

## 2018-01-30 DIAGNOSIS — M9901 Segmental and somatic dysfunction of cervical region: Secondary | ICD-10-CM | POA: Diagnosis not present

## 2018-01-30 DIAGNOSIS — S336XXA Sprain of sacroiliac joint, initial encounter: Secondary | ICD-10-CM | POA: Diagnosis not present

## 2018-01-30 DIAGNOSIS — M9905 Segmental and somatic dysfunction of pelvic region: Secondary | ICD-10-CM | POA: Diagnosis not present

## 2018-01-30 DIAGNOSIS — M4126 Other idiopathic scoliosis, lumbar region: Secondary | ICD-10-CM | POA: Diagnosis not present

## 2018-01-30 DIAGNOSIS — M5383 Other specified dorsopathies, cervicothoracic region: Secondary | ICD-10-CM | POA: Diagnosis not present

## 2018-02-02 DIAGNOSIS — M50321 Other cervical disc degeneration at C4-C5 level: Secondary | ICD-10-CM | POA: Diagnosis not present

## 2018-02-02 DIAGNOSIS — M9901 Segmental and somatic dysfunction of cervical region: Secondary | ICD-10-CM | POA: Diagnosis not present

## 2018-02-02 DIAGNOSIS — M4126 Other idiopathic scoliosis, lumbar region: Secondary | ICD-10-CM | POA: Diagnosis not present

## 2018-02-02 DIAGNOSIS — M4322 Fusion of spine, cervical region: Secondary | ICD-10-CM | POA: Diagnosis not present

## 2018-02-02 DIAGNOSIS — M9902 Segmental and somatic dysfunction of thoracic region: Secondary | ICD-10-CM | POA: Diagnosis not present

## 2018-02-02 DIAGNOSIS — S336XXA Sprain of sacroiliac joint, initial encounter: Secondary | ICD-10-CM | POA: Diagnosis not present

## 2018-02-02 DIAGNOSIS — M5383 Other specified dorsopathies, cervicothoracic region: Secondary | ICD-10-CM | POA: Diagnosis not present

## 2018-02-02 DIAGNOSIS — M9903 Segmental and somatic dysfunction of lumbar region: Secondary | ICD-10-CM | POA: Diagnosis not present

## 2018-02-02 DIAGNOSIS — M9905 Segmental and somatic dysfunction of pelvic region: Secondary | ICD-10-CM | POA: Diagnosis not present

## 2018-02-07 DIAGNOSIS — M50321 Other cervical disc degeneration at C4-C5 level: Secondary | ICD-10-CM | POA: Diagnosis not present

## 2018-02-07 DIAGNOSIS — M4322 Fusion of spine, cervical region: Secondary | ICD-10-CM | POA: Diagnosis not present

## 2018-02-07 DIAGNOSIS — M9901 Segmental and somatic dysfunction of cervical region: Secondary | ICD-10-CM | POA: Diagnosis not present

## 2018-02-07 DIAGNOSIS — M9903 Segmental and somatic dysfunction of lumbar region: Secondary | ICD-10-CM | POA: Diagnosis not present

## 2018-02-07 DIAGNOSIS — M9902 Segmental and somatic dysfunction of thoracic region: Secondary | ICD-10-CM | POA: Diagnosis not present

## 2018-02-07 DIAGNOSIS — M4126 Other idiopathic scoliosis, lumbar region: Secondary | ICD-10-CM | POA: Diagnosis not present

## 2018-02-07 DIAGNOSIS — M9905 Segmental and somatic dysfunction of pelvic region: Secondary | ICD-10-CM | POA: Diagnosis not present

## 2018-02-07 DIAGNOSIS — M5383 Other specified dorsopathies, cervicothoracic region: Secondary | ICD-10-CM | POA: Diagnosis not present

## 2018-02-07 DIAGNOSIS — S336XXA Sprain of sacroiliac joint, initial encounter: Secondary | ICD-10-CM | POA: Diagnosis not present

## 2018-02-14 DIAGNOSIS — M9903 Segmental and somatic dysfunction of lumbar region: Secondary | ICD-10-CM | POA: Diagnosis not present

## 2018-02-14 DIAGNOSIS — M4304 Spondylolysis, thoracic region: Secondary | ICD-10-CM | POA: Diagnosis not present

## 2018-02-14 DIAGNOSIS — M9901 Segmental and somatic dysfunction of cervical region: Secondary | ICD-10-CM | POA: Diagnosis not present

## 2018-02-14 DIAGNOSIS — M4302 Spondylolysis, cervical region: Secondary | ICD-10-CM | POA: Diagnosis not present

## 2018-02-14 DIAGNOSIS — M9902 Segmental and somatic dysfunction of thoracic region: Secondary | ICD-10-CM | POA: Diagnosis not present

## 2018-02-14 DIAGNOSIS — M4306 Spondylolysis, lumbar region: Secondary | ICD-10-CM | POA: Diagnosis not present

## 2018-02-20 DIAGNOSIS — M9901 Segmental and somatic dysfunction of cervical region: Secondary | ICD-10-CM | POA: Diagnosis not present

## 2018-02-20 DIAGNOSIS — M9903 Segmental and somatic dysfunction of lumbar region: Secondary | ICD-10-CM | POA: Diagnosis not present

## 2018-02-20 DIAGNOSIS — M4306 Spondylolysis, lumbar region: Secondary | ICD-10-CM | POA: Diagnosis not present

## 2018-02-20 DIAGNOSIS — M4304 Spondylolysis, thoracic region: Secondary | ICD-10-CM | POA: Diagnosis not present

## 2018-02-20 DIAGNOSIS — M9902 Segmental and somatic dysfunction of thoracic region: Secondary | ICD-10-CM | POA: Diagnosis not present

## 2018-02-20 DIAGNOSIS — M4302 Spondylolysis, cervical region: Secondary | ICD-10-CM | POA: Diagnosis not present

## 2018-02-21 DIAGNOSIS — E559 Vitamin D deficiency, unspecified: Secondary | ICD-10-CM | POA: Diagnosis not present

## 2018-02-21 DIAGNOSIS — E785 Hyperlipidemia, unspecified: Secondary | ICD-10-CM | POA: Diagnosis not present

## 2018-02-21 DIAGNOSIS — J309 Allergic rhinitis, unspecified: Secondary | ICD-10-CM | POA: Diagnosis not present

## 2018-02-21 DIAGNOSIS — Z79899 Other long term (current) drug therapy: Secondary | ICD-10-CM | POA: Diagnosis not present

## 2018-02-21 DIAGNOSIS — Z131 Encounter for screening for diabetes mellitus: Secondary | ICD-10-CM | POA: Diagnosis not present

## 2018-02-21 DIAGNOSIS — Z6822 Body mass index (BMI) 22.0-22.9, adult: Secondary | ICD-10-CM | POA: Diagnosis not present

## 2018-02-28 DIAGNOSIS — M4306 Spondylolysis, lumbar region: Secondary | ICD-10-CM | POA: Diagnosis not present

## 2018-02-28 DIAGNOSIS — M9903 Segmental and somatic dysfunction of lumbar region: Secondary | ICD-10-CM | POA: Diagnosis not present

## 2018-02-28 DIAGNOSIS — M4302 Spondylolysis, cervical region: Secondary | ICD-10-CM | POA: Diagnosis not present

## 2018-02-28 DIAGNOSIS — M4304 Spondylolysis, thoracic region: Secondary | ICD-10-CM | POA: Diagnosis not present

## 2018-02-28 DIAGNOSIS — M9901 Segmental and somatic dysfunction of cervical region: Secondary | ICD-10-CM | POA: Diagnosis not present

## 2018-02-28 DIAGNOSIS — M9902 Segmental and somatic dysfunction of thoracic region: Secondary | ICD-10-CM | POA: Diagnosis not present

## 2018-03-08 DIAGNOSIS — M9901 Segmental and somatic dysfunction of cervical region: Secondary | ICD-10-CM | POA: Diagnosis not present

## 2018-03-08 DIAGNOSIS — M4302 Spondylolysis, cervical region: Secondary | ICD-10-CM | POA: Diagnosis not present

## 2018-03-08 DIAGNOSIS — M9902 Segmental and somatic dysfunction of thoracic region: Secondary | ICD-10-CM | POA: Diagnosis not present

## 2018-03-08 DIAGNOSIS — M4304 Spondylolysis, thoracic region: Secondary | ICD-10-CM | POA: Diagnosis not present

## 2018-03-08 DIAGNOSIS — M4306 Spondylolysis, lumbar region: Secondary | ICD-10-CM | POA: Diagnosis not present

## 2018-03-08 DIAGNOSIS — M9903 Segmental and somatic dysfunction of lumbar region: Secondary | ICD-10-CM | POA: Diagnosis not present

## 2018-03-21 DIAGNOSIS — M4126 Other idiopathic scoliosis, lumbar region: Secondary | ICD-10-CM | POA: Diagnosis not present

## 2018-03-21 DIAGNOSIS — M9901 Segmental and somatic dysfunction of cervical region: Secondary | ICD-10-CM | POA: Diagnosis not present

## 2018-03-21 DIAGNOSIS — M9903 Segmental and somatic dysfunction of lumbar region: Secondary | ICD-10-CM | POA: Diagnosis not present

## 2018-03-21 DIAGNOSIS — M9904 Segmental and somatic dysfunction of sacral region: Secondary | ICD-10-CM | POA: Diagnosis not present

## 2018-03-21 DIAGNOSIS — M4302 Spondylolysis, cervical region: Secondary | ICD-10-CM | POA: Diagnosis not present

## 2018-03-21 DIAGNOSIS — M5388 Other specified dorsopathies, sacral and sacrococcygeal region: Secondary | ICD-10-CM | POA: Diagnosis not present

## 2018-03-26 DIAGNOSIS — J309 Allergic rhinitis, unspecified: Secondary | ICD-10-CM | POA: Diagnosis not present

## 2018-03-26 DIAGNOSIS — J0510 Acute epiglottitis without obstruction: Secondary | ICD-10-CM | POA: Diagnosis not present

## 2018-03-26 DIAGNOSIS — Z6822 Body mass index (BMI) 22.0-22.9, adult: Secondary | ICD-10-CM | POA: Diagnosis not present

## 2018-03-26 DIAGNOSIS — J029 Acute pharyngitis, unspecified: Secondary | ICD-10-CM | POA: Diagnosis not present

## 2018-03-29 DIAGNOSIS — M9901 Segmental and somatic dysfunction of cervical region: Secondary | ICD-10-CM | POA: Diagnosis not present

## 2018-03-29 DIAGNOSIS — M4302 Spondylolysis, cervical region: Secondary | ICD-10-CM | POA: Diagnosis not present

## 2018-03-29 DIAGNOSIS — M4126 Other idiopathic scoliosis, lumbar region: Secondary | ICD-10-CM | POA: Diagnosis not present

## 2018-03-29 DIAGNOSIS — M5388 Other specified dorsopathies, sacral and sacrococcygeal region: Secondary | ICD-10-CM | POA: Diagnosis not present

## 2018-03-29 DIAGNOSIS — M9904 Segmental and somatic dysfunction of sacral region: Secondary | ICD-10-CM | POA: Diagnosis not present

## 2018-03-29 DIAGNOSIS — M9903 Segmental and somatic dysfunction of lumbar region: Secondary | ICD-10-CM | POA: Diagnosis not present

## 2018-04-03 DIAGNOSIS — M4126 Other idiopathic scoliosis, lumbar region: Secondary | ICD-10-CM | POA: Diagnosis not present

## 2018-04-03 DIAGNOSIS — M4302 Spondylolysis, cervical region: Secondary | ICD-10-CM | POA: Diagnosis not present

## 2018-04-03 DIAGNOSIS — M9901 Segmental and somatic dysfunction of cervical region: Secondary | ICD-10-CM | POA: Diagnosis not present

## 2018-04-03 DIAGNOSIS — M9904 Segmental and somatic dysfunction of sacral region: Secondary | ICD-10-CM | POA: Diagnosis not present

## 2018-04-03 DIAGNOSIS — M9903 Segmental and somatic dysfunction of lumbar region: Secondary | ICD-10-CM | POA: Diagnosis not present

## 2018-04-03 DIAGNOSIS — M5388 Other specified dorsopathies, sacral and sacrococcygeal region: Secondary | ICD-10-CM | POA: Diagnosis not present

## 2018-04-20 DIAGNOSIS — M4726 Other spondylosis with radiculopathy, lumbar region: Secondary | ICD-10-CM | POA: Diagnosis not present

## 2018-04-20 DIAGNOSIS — M9902 Segmental and somatic dysfunction of thoracic region: Secondary | ICD-10-CM | POA: Diagnosis not present

## 2018-04-20 DIAGNOSIS — M4125 Other idiopathic scoliosis, thoracolumbar region: Secondary | ICD-10-CM | POA: Diagnosis not present

## 2018-04-20 DIAGNOSIS — M9901 Segmental and somatic dysfunction of cervical region: Secondary | ICD-10-CM | POA: Diagnosis not present

## 2018-04-20 DIAGNOSIS — M9903 Segmental and somatic dysfunction of lumbar region: Secondary | ICD-10-CM | POA: Diagnosis not present

## 2018-04-20 DIAGNOSIS — M4302 Spondylolysis, cervical region: Secondary | ICD-10-CM | POA: Diagnosis not present

## 2018-04-26 DIAGNOSIS — M9903 Segmental and somatic dysfunction of lumbar region: Secondary | ICD-10-CM | POA: Diagnosis not present

## 2018-04-26 DIAGNOSIS — M9901 Segmental and somatic dysfunction of cervical region: Secondary | ICD-10-CM | POA: Diagnosis not present

## 2018-04-26 DIAGNOSIS — M4726 Other spondylosis with radiculopathy, lumbar region: Secondary | ICD-10-CM | POA: Diagnosis not present

## 2018-04-26 DIAGNOSIS — M9902 Segmental and somatic dysfunction of thoracic region: Secondary | ICD-10-CM | POA: Diagnosis not present

## 2018-04-26 DIAGNOSIS — M4302 Spondylolysis, cervical region: Secondary | ICD-10-CM | POA: Diagnosis not present

## 2018-04-26 DIAGNOSIS — M4125 Other idiopathic scoliosis, thoracolumbar region: Secondary | ICD-10-CM | POA: Diagnosis not present

## 2018-05-03 DIAGNOSIS — M9901 Segmental and somatic dysfunction of cervical region: Secondary | ICD-10-CM | POA: Diagnosis not present

## 2018-05-03 DIAGNOSIS — M9903 Segmental and somatic dysfunction of lumbar region: Secondary | ICD-10-CM | POA: Diagnosis not present

## 2018-05-03 DIAGNOSIS — M4302 Spondylolysis, cervical region: Secondary | ICD-10-CM | POA: Diagnosis not present

## 2018-05-03 DIAGNOSIS — M4726 Other spondylosis with radiculopathy, lumbar region: Secondary | ICD-10-CM | POA: Diagnosis not present

## 2018-05-03 DIAGNOSIS — M9902 Segmental and somatic dysfunction of thoracic region: Secondary | ICD-10-CM | POA: Diagnosis not present

## 2018-05-03 DIAGNOSIS — M4125 Other idiopathic scoliosis, thoracolumbar region: Secondary | ICD-10-CM | POA: Diagnosis not present

## 2018-05-10 DIAGNOSIS — M4726 Other spondylosis with radiculopathy, lumbar region: Secondary | ICD-10-CM | POA: Diagnosis not present

## 2018-05-10 DIAGNOSIS — M4125 Other idiopathic scoliosis, thoracolumbar region: Secondary | ICD-10-CM | POA: Diagnosis not present

## 2018-05-10 DIAGNOSIS — M4302 Spondylolysis, cervical region: Secondary | ICD-10-CM | POA: Diagnosis not present

## 2018-05-10 DIAGNOSIS — M9902 Segmental and somatic dysfunction of thoracic region: Secondary | ICD-10-CM | POA: Diagnosis not present

## 2018-05-10 DIAGNOSIS — M9901 Segmental and somatic dysfunction of cervical region: Secondary | ICD-10-CM | POA: Diagnosis not present

## 2018-05-10 DIAGNOSIS — M9903 Segmental and somatic dysfunction of lumbar region: Secondary | ICD-10-CM | POA: Diagnosis not present

## 2018-05-17 DIAGNOSIS — M9902 Segmental and somatic dysfunction of thoracic region: Secondary | ICD-10-CM | POA: Diagnosis not present

## 2018-05-17 DIAGNOSIS — M4726 Other spondylosis with radiculopathy, lumbar region: Secondary | ICD-10-CM | POA: Diagnosis not present

## 2018-05-17 DIAGNOSIS — M9903 Segmental and somatic dysfunction of lumbar region: Secondary | ICD-10-CM | POA: Diagnosis not present

## 2018-05-17 DIAGNOSIS — M9901 Segmental and somatic dysfunction of cervical region: Secondary | ICD-10-CM | POA: Diagnosis not present

## 2018-05-17 DIAGNOSIS — M4125 Other idiopathic scoliosis, thoracolumbar region: Secondary | ICD-10-CM | POA: Diagnosis not present

## 2018-05-17 DIAGNOSIS — M4302 Spondylolysis, cervical region: Secondary | ICD-10-CM | POA: Diagnosis not present

## 2018-06-04 DIAGNOSIS — M4125 Other idiopathic scoliosis, thoracolumbar region: Secondary | ICD-10-CM | POA: Diagnosis not present

## 2018-06-04 DIAGNOSIS — M9902 Segmental and somatic dysfunction of thoracic region: Secondary | ICD-10-CM | POA: Diagnosis not present

## 2018-06-04 DIAGNOSIS — M9903 Segmental and somatic dysfunction of lumbar region: Secondary | ICD-10-CM | POA: Diagnosis not present

## 2018-06-04 DIAGNOSIS — M4722 Other spondylosis with radiculopathy, cervical region: Secondary | ICD-10-CM | POA: Diagnosis not present

## 2018-06-04 DIAGNOSIS — M4126 Other idiopathic scoliosis, lumbar region: Secondary | ICD-10-CM | POA: Diagnosis not present

## 2018-06-04 DIAGNOSIS — M9901 Segmental and somatic dysfunction of cervical region: Secondary | ICD-10-CM | POA: Diagnosis not present

## 2018-06-06 DIAGNOSIS — L578 Other skin changes due to chronic exposure to nonionizing radiation: Secondary | ICD-10-CM | POA: Diagnosis not present

## 2018-06-06 DIAGNOSIS — L821 Other seborrheic keratosis: Secondary | ICD-10-CM | POA: Diagnosis not present

## 2018-06-06 DIAGNOSIS — L918 Other hypertrophic disorders of the skin: Secondary | ICD-10-CM | POA: Diagnosis not present

## 2018-06-06 DIAGNOSIS — L820 Inflamed seborrheic keratosis: Secondary | ICD-10-CM | POA: Diagnosis not present

## 2018-06-06 DIAGNOSIS — L719 Rosacea, unspecified: Secondary | ICD-10-CM | POA: Diagnosis not present

## 2018-06-13 DIAGNOSIS — M4722 Other spondylosis with radiculopathy, cervical region: Secondary | ICD-10-CM | POA: Diagnosis not present

## 2018-06-13 DIAGNOSIS — M4125 Other idiopathic scoliosis, thoracolumbar region: Secondary | ICD-10-CM | POA: Diagnosis not present

## 2018-06-13 DIAGNOSIS — M4126 Other idiopathic scoliosis, lumbar region: Secondary | ICD-10-CM | POA: Diagnosis not present

## 2018-06-13 DIAGNOSIS — M9902 Segmental and somatic dysfunction of thoracic region: Secondary | ICD-10-CM | POA: Diagnosis not present

## 2018-06-13 DIAGNOSIS — M9901 Segmental and somatic dysfunction of cervical region: Secondary | ICD-10-CM | POA: Diagnosis not present

## 2018-06-13 DIAGNOSIS — M9903 Segmental and somatic dysfunction of lumbar region: Secondary | ICD-10-CM | POA: Diagnosis not present

## 2018-06-21 DIAGNOSIS — M9901 Segmental and somatic dysfunction of cervical region: Secondary | ICD-10-CM | POA: Diagnosis not present

## 2018-06-21 DIAGNOSIS — M9902 Segmental and somatic dysfunction of thoracic region: Secondary | ICD-10-CM | POA: Diagnosis not present

## 2018-06-21 DIAGNOSIS — M9903 Segmental and somatic dysfunction of lumbar region: Secondary | ICD-10-CM | POA: Diagnosis not present

## 2018-06-21 DIAGNOSIS — M4125 Other idiopathic scoliosis, thoracolumbar region: Secondary | ICD-10-CM | POA: Diagnosis not present

## 2018-06-21 DIAGNOSIS — M4722 Other spondylosis with radiculopathy, cervical region: Secondary | ICD-10-CM | POA: Diagnosis not present

## 2018-06-21 DIAGNOSIS — M4126 Other idiopathic scoliosis, lumbar region: Secondary | ICD-10-CM | POA: Diagnosis not present

## 2018-06-27 DIAGNOSIS — M4125 Other idiopathic scoliosis, thoracolumbar region: Secondary | ICD-10-CM | POA: Diagnosis not present

## 2018-06-27 DIAGNOSIS — M9902 Segmental and somatic dysfunction of thoracic region: Secondary | ICD-10-CM | POA: Diagnosis not present

## 2018-06-27 DIAGNOSIS — M4126 Other idiopathic scoliosis, lumbar region: Secondary | ICD-10-CM | POA: Diagnosis not present

## 2018-06-27 DIAGNOSIS — M9901 Segmental and somatic dysfunction of cervical region: Secondary | ICD-10-CM | POA: Diagnosis not present

## 2018-06-27 DIAGNOSIS — M4722 Other spondylosis with radiculopathy, cervical region: Secondary | ICD-10-CM | POA: Diagnosis not present

## 2018-06-27 DIAGNOSIS — M9903 Segmental and somatic dysfunction of lumbar region: Secondary | ICD-10-CM | POA: Diagnosis not present

## 2018-07-05 DIAGNOSIS — M9902 Segmental and somatic dysfunction of thoracic region: Secondary | ICD-10-CM | POA: Diagnosis not present

## 2018-07-05 DIAGNOSIS — M4306 Spondylolysis, lumbar region: Secondary | ICD-10-CM | POA: Diagnosis not present

## 2018-07-05 DIAGNOSIS — M9901 Segmental and somatic dysfunction of cervical region: Secondary | ICD-10-CM | POA: Diagnosis not present

## 2018-07-05 DIAGNOSIS — M4302 Spondylolysis, cervical region: Secondary | ICD-10-CM | POA: Diagnosis not present

## 2018-07-05 DIAGNOSIS — M4305 Spondylolysis, thoracolumbar region: Secondary | ICD-10-CM | POA: Diagnosis not present

## 2018-07-05 DIAGNOSIS — M9903 Segmental and somatic dysfunction of lumbar region: Secondary | ICD-10-CM | POA: Diagnosis not present

## 2018-07-16 DIAGNOSIS — E785 Hyperlipidemia, unspecified: Secondary | ICD-10-CM | POA: Diagnosis not present

## 2018-07-16 DIAGNOSIS — J309 Allergic rhinitis, unspecified: Secondary | ICD-10-CM | POA: Diagnosis not present

## 2018-07-16 DIAGNOSIS — R413 Other amnesia: Secondary | ICD-10-CM | POA: Diagnosis not present

## 2018-07-16 DIAGNOSIS — M159 Polyosteoarthritis, unspecified: Secondary | ICD-10-CM | POA: Diagnosis not present

## 2018-07-23 DIAGNOSIS — M9903 Segmental and somatic dysfunction of lumbar region: Secondary | ICD-10-CM | POA: Diagnosis not present

## 2018-07-23 DIAGNOSIS — M4305 Spondylolysis, thoracolumbar region: Secondary | ICD-10-CM | POA: Diagnosis not present

## 2018-07-23 DIAGNOSIS — M9902 Segmental and somatic dysfunction of thoracic region: Secondary | ICD-10-CM | POA: Diagnosis not present

## 2018-07-23 DIAGNOSIS — M4302 Spondylolysis, cervical region: Secondary | ICD-10-CM | POA: Diagnosis not present

## 2018-07-23 DIAGNOSIS — M4306 Spondylolysis, lumbar region: Secondary | ICD-10-CM | POA: Diagnosis not present

## 2018-07-23 DIAGNOSIS — M9901 Segmental and somatic dysfunction of cervical region: Secondary | ICD-10-CM | POA: Diagnosis not present

## 2018-07-31 DIAGNOSIS — M545 Low back pain: Secondary | ICD-10-CM | POA: Diagnosis not present

## 2018-07-31 DIAGNOSIS — R5383 Other fatigue: Secondary | ICD-10-CM | POA: Diagnosis not present

## 2018-07-31 DIAGNOSIS — J309 Allergic rhinitis, unspecified: Secondary | ICD-10-CM | POA: Diagnosis not present

## 2018-07-31 DIAGNOSIS — M159 Polyosteoarthritis, unspecified: Secondary | ICD-10-CM | POA: Diagnosis not present

## 2018-08-07 DIAGNOSIS — M9902 Segmental and somatic dysfunction of thoracic region: Secondary | ICD-10-CM | POA: Diagnosis not present

## 2018-08-07 DIAGNOSIS — M9903 Segmental and somatic dysfunction of lumbar region: Secondary | ICD-10-CM | POA: Diagnosis not present

## 2018-08-07 DIAGNOSIS — M9901 Segmental and somatic dysfunction of cervical region: Secondary | ICD-10-CM | POA: Diagnosis not present

## 2018-08-07 DIAGNOSIS — M4306 Spondylolysis, lumbar region: Secondary | ICD-10-CM | POA: Diagnosis not present

## 2018-08-07 DIAGNOSIS — M4305 Spondylolysis, thoracolumbar region: Secondary | ICD-10-CM | POA: Diagnosis not present

## 2018-08-07 DIAGNOSIS — M4302 Spondylolysis, cervical region: Secondary | ICD-10-CM | POA: Diagnosis not present

## 2018-08-23 DIAGNOSIS — M9903 Segmental and somatic dysfunction of lumbar region: Secondary | ICD-10-CM | POA: Diagnosis not present

## 2018-08-23 DIAGNOSIS — M9901 Segmental and somatic dysfunction of cervical region: Secondary | ICD-10-CM | POA: Diagnosis not present

## 2018-08-23 DIAGNOSIS — M4306 Spondylolysis, lumbar region: Secondary | ICD-10-CM | POA: Diagnosis not present

## 2018-08-23 DIAGNOSIS — M9902 Segmental and somatic dysfunction of thoracic region: Secondary | ICD-10-CM | POA: Diagnosis not present

## 2018-08-23 DIAGNOSIS — M4303 Spondylolysis, cervicothoracic region: Secondary | ICD-10-CM | POA: Diagnosis not present

## 2018-08-23 DIAGNOSIS — M4305 Spondylolysis, thoracolumbar region: Secondary | ICD-10-CM | POA: Diagnosis not present

## 2018-09-06 DIAGNOSIS — M4303 Spondylolysis, cervicothoracic region: Secondary | ICD-10-CM | POA: Diagnosis not present

## 2018-09-06 DIAGNOSIS — M9902 Segmental and somatic dysfunction of thoracic region: Secondary | ICD-10-CM | POA: Diagnosis not present

## 2018-09-06 DIAGNOSIS — M9901 Segmental and somatic dysfunction of cervical region: Secondary | ICD-10-CM | POA: Diagnosis not present

## 2018-09-06 DIAGNOSIS — M9903 Segmental and somatic dysfunction of lumbar region: Secondary | ICD-10-CM | POA: Diagnosis not present

## 2018-09-06 DIAGNOSIS — M4305 Spondylolysis, thoracolumbar region: Secondary | ICD-10-CM | POA: Diagnosis not present

## 2018-09-06 DIAGNOSIS — M4306 Spondylolysis, lumbar region: Secondary | ICD-10-CM | POA: Diagnosis not present

## 2018-09-20 DIAGNOSIS — M4306 Spondylolysis, lumbar region: Secondary | ICD-10-CM | POA: Diagnosis not present

## 2018-09-20 DIAGNOSIS — M9902 Segmental and somatic dysfunction of thoracic region: Secondary | ICD-10-CM | POA: Diagnosis not present

## 2018-09-20 DIAGNOSIS — M4305 Spondylolysis, thoracolumbar region: Secondary | ICD-10-CM | POA: Diagnosis not present

## 2018-09-20 DIAGNOSIS — M9903 Segmental and somatic dysfunction of lumbar region: Secondary | ICD-10-CM | POA: Diagnosis not present

## 2018-09-20 DIAGNOSIS — M4303 Spondylolysis, cervicothoracic region: Secondary | ICD-10-CM | POA: Diagnosis not present

## 2018-09-20 DIAGNOSIS — M9901 Segmental and somatic dysfunction of cervical region: Secondary | ICD-10-CM | POA: Diagnosis not present

## 2018-10-02 DIAGNOSIS — R109 Unspecified abdominal pain: Secondary | ICD-10-CM | POA: Diagnosis not present

## 2018-10-02 DIAGNOSIS — Z6822 Body mass index (BMI) 22.0-22.9, adult: Secondary | ICD-10-CM | POA: Diagnosis not present

## 2018-10-02 DIAGNOSIS — K219 Gastro-esophageal reflux disease without esophagitis: Secondary | ICD-10-CM | POA: Diagnosis not present

## 2018-10-02 DIAGNOSIS — R140 Abdominal distension (gaseous): Secondary | ICD-10-CM | POA: Diagnosis not present

## 2018-10-11 DIAGNOSIS — M9901 Segmental and somatic dysfunction of cervical region: Secondary | ICD-10-CM | POA: Diagnosis not present

## 2018-10-11 DIAGNOSIS — M4316 Spondylolisthesis, lumbar region: Secondary | ICD-10-CM | POA: Diagnosis not present

## 2018-10-11 DIAGNOSIS — M4304 Spondylolysis, thoracic region: Secondary | ICD-10-CM | POA: Diagnosis not present

## 2018-10-11 DIAGNOSIS — M9902 Segmental and somatic dysfunction of thoracic region: Secondary | ICD-10-CM | POA: Diagnosis not present

## 2018-10-11 DIAGNOSIS — M4302 Spondylolysis, cervical region: Secondary | ICD-10-CM | POA: Diagnosis not present

## 2018-10-11 DIAGNOSIS — M9903 Segmental and somatic dysfunction of lumbar region: Secondary | ICD-10-CM | POA: Diagnosis not present

## 2018-10-17 DIAGNOSIS — Z0000 Encounter for general adult medical examination without abnormal findings: Secondary | ICD-10-CM | POA: Diagnosis not present

## 2018-10-17 DIAGNOSIS — E785 Hyperlipidemia, unspecified: Secondary | ICD-10-CM | POA: Diagnosis not present

## 2018-10-17 DIAGNOSIS — Z9181 History of falling: Secondary | ICD-10-CM | POA: Diagnosis not present

## 2018-10-23 DIAGNOSIS — M4304 Spondylolysis, thoracic region: Secondary | ICD-10-CM | POA: Diagnosis not present

## 2018-10-23 DIAGNOSIS — M4302 Spondylolysis, cervical region: Secondary | ICD-10-CM | POA: Diagnosis not present

## 2018-10-23 DIAGNOSIS — M9902 Segmental and somatic dysfunction of thoracic region: Secondary | ICD-10-CM | POA: Diagnosis not present

## 2018-10-23 DIAGNOSIS — M9903 Segmental and somatic dysfunction of lumbar region: Secondary | ICD-10-CM | POA: Diagnosis not present

## 2018-10-23 DIAGNOSIS — M4316 Spondylolisthesis, lumbar region: Secondary | ICD-10-CM | POA: Diagnosis not present

## 2018-10-23 DIAGNOSIS — M9901 Segmental and somatic dysfunction of cervical region: Secondary | ICD-10-CM | POA: Diagnosis not present

## 2018-10-24 DIAGNOSIS — Z79899 Other long term (current) drug therapy: Secondary | ICD-10-CM | POA: Diagnosis not present

## 2018-10-24 DIAGNOSIS — G8929 Other chronic pain: Secondary | ICD-10-CM | POA: Diagnosis not present

## 2018-10-24 DIAGNOSIS — E559 Vitamin D deficiency, unspecified: Secondary | ICD-10-CM | POA: Diagnosis not present

## 2018-10-24 DIAGNOSIS — M549 Dorsalgia, unspecified: Secondary | ICD-10-CM | POA: Diagnosis not present

## 2018-10-25 DIAGNOSIS — Z79899 Other long term (current) drug therapy: Secondary | ICD-10-CM | POA: Diagnosis not present

## 2018-10-25 DIAGNOSIS — E559 Vitamin D deficiency, unspecified: Secondary | ICD-10-CM | POA: Diagnosis not present

## 2018-10-25 DIAGNOSIS — E785 Hyperlipidemia, unspecified: Secondary | ICD-10-CM | POA: Diagnosis not present

## 2018-11-06 DIAGNOSIS — M4303 Spondylolysis, cervicothoracic region: Secondary | ICD-10-CM | POA: Diagnosis not present

## 2018-11-06 DIAGNOSIS — M5388 Other specified dorsopathies, sacral and sacrococcygeal region: Secondary | ICD-10-CM | POA: Diagnosis not present

## 2018-11-06 DIAGNOSIS — M9904 Segmental and somatic dysfunction of sacral region: Secondary | ICD-10-CM | POA: Diagnosis not present

## 2018-11-06 DIAGNOSIS — M9903 Segmental and somatic dysfunction of lumbar region: Secondary | ICD-10-CM | POA: Diagnosis not present

## 2018-11-06 DIAGNOSIS — M4726 Other spondylosis with radiculopathy, lumbar region: Secondary | ICD-10-CM | POA: Diagnosis not present

## 2018-11-06 DIAGNOSIS — M9901 Segmental and somatic dysfunction of cervical region: Secondary | ICD-10-CM | POA: Diagnosis not present

## 2018-11-21 DIAGNOSIS — F419 Anxiety disorder, unspecified: Secondary | ICD-10-CM | POA: Diagnosis not present

## 2018-11-21 DIAGNOSIS — R7989 Other specified abnormal findings of blood chemistry: Secondary | ICD-10-CM | POA: Diagnosis not present

## 2018-11-21 DIAGNOSIS — J309 Allergic rhinitis, unspecified: Secondary | ICD-10-CM | POA: Diagnosis not present

## 2018-11-21 DIAGNOSIS — E785 Hyperlipidemia, unspecified: Secondary | ICD-10-CM | POA: Diagnosis not present

## 2018-11-22 DIAGNOSIS — M9901 Segmental and somatic dysfunction of cervical region: Secondary | ICD-10-CM | POA: Diagnosis not present

## 2018-11-22 DIAGNOSIS — M5388 Other specified dorsopathies, sacral and sacrococcygeal region: Secondary | ICD-10-CM | POA: Diagnosis not present

## 2018-11-22 DIAGNOSIS — M4726 Other spondylosis with radiculopathy, lumbar region: Secondary | ICD-10-CM | POA: Diagnosis not present

## 2018-11-22 DIAGNOSIS — M9903 Segmental and somatic dysfunction of lumbar region: Secondary | ICD-10-CM | POA: Diagnosis not present

## 2018-11-22 DIAGNOSIS — M9904 Segmental and somatic dysfunction of sacral region: Secondary | ICD-10-CM | POA: Diagnosis not present

## 2018-11-22 DIAGNOSIS — M4303 Spondylolysis, cervicothoracic region: Secondary | ICD-10-CM | POA: Diagnosis not present

## 2018-12-04 DIAGNOSIS — M4303 Spondylolysis, cervicothoracic region: Secondary | ICD-10-CM | POA: Diagnosis not present

## 2018-12-04 DIAGNOSIS — M9901 Segmental and somatic dysfunction of cervical region: Secondary | ICD-10-CM | POA: Diagnosis not present

## 2018-12-04 DIAGNOSIS — M4726 Other spondylosis with radiculopathy, lumbar region: Secondary | ICD-10-CM | POA: Diagnosis not present

## 2018-12-04 DIAGNOSIS — M9904 Segmental and somatic dysfunction of sacral region: Secondary | ICD-10-CM | POA: Diagnosis not present

## 2018-12-04 DIAGNOSIS — M5388 Other specified dorsopathies, sacral and sacrococcygeal region: Secondary | ICD-10-CM | POA: Diagnosis not present

## 2018-12-04 DIAGNOSIS — M9903 Segmental and somatic dysfunction of lumbar region: Secondary | ICD-10-CM | POA: Diagnosis not present

## 2018-12-05 ENCOUNTER — Encounter: Payer: Self-pay | Admitting: Allergy and Immunology

## 2018-12-05 ENCOUNTER — Ambulatory Visit (INDEPENDENT_AMBULATORY_CARE_PROVIDER_SITE_OTHER): Payer: Medicare Other | Admitting: Allergy and Immunology

## 2018-12-05 ENCOUNTER — Other Ambulatory Visit: Payer: Self-pay

## 2018-12-05 VITALS — BP 136/62 | HR 65 | Temp 97.2°F | Resp 18 | Ht 61.0 in | Wt 126.0 lb

## 2018-12-05 DIAGNOSIS — J3089 Other allergic rhinitis: Secondary | ICD-10-CM

## 2018-12-05 DIAGNOSIS — K219 Gastro-esophageal reflux disease without esophagitis: Secondary | ICD-10-CM

## 2018-12-05 DIAGNOSIS — M3500 Sicca syndrome, unspecified: Secondary | ICD-10-CM | POA: Diagnosis not present

## 2018-12-05 MED ORDER — AZELASTINE HCL 0.1 % NA SOLN: NASAL | 12 refills | Status: DC

## 2018-12-05 MED ORDER — FAMOTIDINE 40 MG PO TABS: 40.0000 mg | ORAL_TABLET | Freq: Every day | ORAL | 5 refills | Status: DC

## 2018-12-05 NOTE — Patient Instructions (Addendum)
  1.  Allergen avoidance measures  2.  Every night use the following medications sprayed in nose:   A. OTC Afrin - 1 spray single nostril. Alternate nostrils every night  B. OTC Nasacort - 1 spray each nostril  C. Azelastine - 1 spray each nostril  3.  Treat reflux / LPR:   A.  Continue pantoprazole 40 mg - 1 tablet in morning  B.  Start famotidine 40 mg - 1 tablet in the evening  C.  Slowly consolidate all caffeine and chocolate consumption  4.  Does montelukast help?  If not discontinue this medicine  5.  Does oral antihistamine help?  If not discontinue this medicine  6.  Return to clinic in 3 weeks or earlier if problem

## 2018-12-05 NOTE — Progress Notes (Signed)
[place] - [place] - [place] - [place] - [place]   Dear Dr. Rica Records,  Thank you for referring Sheryl Ball to the [facility] on 12/05/2018.   Below is a summation of this patient's evaluation and recommendations.  Thank you for your referral. I will keep you informed about this patient's response to treatment.   If you have any questions please do not hesitate to contact me.   Sincerely,  Jiles Prows, MD Allergy / Immunology [facility]   ______________________________________________________________________    NEW PATIENT NOTE  Referring Provider: Nicholos Johns, MD Primary Provider: Nicholos Johns, MD Date of office visit: 12/05/2018    Subjective:   Chief Complaint:  Sheryl Ball (DOB: January 20, 1946) is a 73 y.o. female who presents to the clinic on 12/05/2018 with a chief complaint of Nasal Congestion .     HPI: Sheryl Ball presents to this clinic in evaluation of several issues.  First, she has nasal congestion and stuffiness without any anosmia or ugly nasal discharge or headache that appears to become quite severe at nighttime.  She states that when she lays down at night her nose swells up and then she cannot breathe through her nose and then she has to do a lot of mouth breathing and she ends up with a very dry mouth for which she needs to get up and use cough drops.  This definitely disturbs her sleep.  She does have a history of dry eye syndrome treated with over-the-counter lubricating drops.  She has been treated with several medications, of which she cannot really remember the name, that has not helped her including some nose sprays.  She does not really note any obvious provoking factor giving rise to this issue.  Second, she has constant postnasal drip and throat clearing and intermittent raspy voice.  She can no longer sing at church.  She is definitely much worse regarding  this issue when she wakes up in the morning.  She does have reflux disease and she is being treated with pantoprazole which she thinks does help this issue.  It should be noted that she is consuming caffeine with a cup of coffee and usually a cup of tea per day and intermittently a soda throughout the week and daily chocolate consumption.  Past Medical History:  Diagnosis Date  . Anxiety   . Arthritis   . Depression   . GERD (gastroesophageal reflux disease)   . Hypercholesteremia   . IBS (irritable bowel syndrome)   . Mitral valve prolapse   . Osteoarthritis   . Panic disorder     Past Surgical History:  Procedure Laterality Date  . ABDOMINAL HYSTERECTOMY     30 years ago  . BACK SURGERY     C5-C7 fusion  . BLADDER SURGERY     bladder lifted twice  . COLONOSCOPY  11/09/2012   Moderate internal hemorrhoids. Mild sigmoid diverticulosis. THe colon was highly redundant.   . COLONOSCOPY WITH ESOPHAGOGASTRODUODENOSCOPY (EGD)    . ESOPHAGOGASTRODUODENOSCOPY  03/10/2010   Schatzkis ring status post dilitation. Small hiatal hernia.   Marland Kitchen HERNIA REPAIR  1998  . TONSILLECTOMY      Allergies as of 12/05/2018      Reactions   Levaquin [levofloxacin]    Morphine Sulfate Nausea And Vomiting   Prednisone       Medication List    Aleve 220 MG Caps Generic drug: Naproxen Sodium Take by  mouth as needed.   aspirin EC 81 MG tablet Take 81 mg by mouth daily.   baclofen 10 MG tablet Commonly known as: LIORESAL TAKE 1 TABLET BY MOUTH TWICE A DAY AS NEEDED INSTEAD OF TIZANIDINE   Biotin 10000 MCG Tabs Take by mouth daily.   busPIRone 10 MG tablet Commonly known as: BUSPAR Take 10 mg by mouth daily.   CALCIUM PO Take by mouth.   CoQ-10 200 MG Caps Take 1 tablet by mouth daily.   donepezil 10 MG tablet Commonly known as: ARICEPT Take 10 mg by mouth daily.   doxycycline 20 MG tablet Commonly known as: PERIOSTAT Take 20 mg by mouth 2 (two) times daily with a meal.    DULoxetine 60 MG capsule Commonly known as: CYMBALTA Take 2 capsules by mouth daily.   gabapentin 300 MG capsule Commonly known as: NEURONTIN Take 300 mg by mouth 2 (two) times daily.   KLS Aller-Fex 180 MG tablet Generic drug: fexofenadine Take 180 mg by mouth daily.   KLS Aller-Flo 50 MCG/ACT nasal spray Generic drug: fluticasone Place 1 spray into both nostrils daily.   Magnesium 250 MG Tabs Take 1 tablet by mouth daily.   meclizine 12.5 MG tablet Commonly known as: ANTIVERT Take 1 tablet by mouth daily.   memantine 10 MG tablet Commonly known as: NAMENDA Take 10 mg by mouth daily.   montelukast 10 MG tablet Commonly known as: SINGULAIR TAKE 1 TABLET BY MOUTH EVERYDAY AT BEDTIME   oxybutynin 10 MG 24 hr tablet Commonly known as: DITROPAN-XL Take 10 mg by mouth daily.   pantoprazole 40 MG tablet Commonly known as: PROTONIX Take 1 tablet (40 mg total) by mouth daily.   pravastatin 80 MG tablet Commonly known as: PRAVACHOL Take 80 mg by mouth daily.   traZODone 50 MG tablet Commonly known as: DESYREL Take 125 mg by mouth daily. Take 2.5 tablets everyday   TYLENOL PO Take by mouth as needed.   vitamin B-12 1000 MCG tablet Commonly known as: CYANOCOBALAMIN Take by mouth.   VITAMIN D3 PO Take by mouth.       Review of systems negative except as noted in HPI / PMHx or noted below:  Review of Systems  Constitutional: Negative.   HENT: Negative.   Eyes: Negative.   Respiratory: Negative.   Cardiovascular: Negative.   Gastrointestinal: Negative.   Genitourinary: Negative.   Musculoskeletal: Negative.   Skin: Negative.   Neurological: Negative.   Endo/Heme/Allergies: Negative.   Psychiatric/Behavioral: Negative.     Family History  Problem Relation Age of Onset  . Cancer Father   . Colon cancer Neg Hx   . Esophageal cancer Neg Hx     Social History   Socioeconomic History  . Marital status: Married    Spouse name: Not on file  . Number  of children: 2  . Years of education: Not on file  . Highest education level: Not on file  Occupational History  . Occupation: Retired  Scientific laboratory technician  . Financial resource strain: Not on file  . Food insecurity    Worry: Not on file    Inability: Not on file  . Transportation needs    Medical: Not on file    Non-medical: Not on file  Tobacco Use  . Smoking status: Never Smoker  . Smokeless tobacco: Never Used  Substance and Sexual Activity  . Alcohol use: Never    Frequency: Never  . Drug use: Never  . Sexual activity: Not on  file  Lifestyle  . Physical activity    Days per week: Not on file    Minutes per session: Not on file  . Stress: Not on file  Relationships  . Social Herbalist on phone: Not on file    Gets together: Not on file    Attends religious service: Not on file    Active member of club or organization: Not on file    Attends meetings of clubs or organizations: Not on file    Relationship status: Not on file  . Intimate partner violence    Fear of current or ex partner: Not on file    Emotionally abused: Not on file    Physically abused: Not on file    Forced sexual activity: Not on file  Other Topics Concern  . Not on file  Social History Narrative  . Not on file    Environmental and Social history  Lives in a house with a dry environment, a dog located inside the household, no carpet in the bedroom, plastic on the bed, plastic on the pillow, no smoking ongoing with inside the household.  Objective:   Vitals:   12/05/18 1357  BP: 136/62  Pulse: 65  Resp: 18  Temp: (!) 97.2 F (36.2 C)  SpO2: 95%   Height: 5\' 1"  (154.9 cm) Weight: 126 lb (57.2 kg)  Physical Exam Constitutional:      Appearance: She is not diaphoretic.  HENT:     Head: Normocephalic. No right periorbital erythema or left periorbital erythema.     Right Ear: Tympanic membrane, ear canal and external ear normal.     Left Ear: Tympanic membrane, ear canal and  external ear normal.     Nose: Mucosal edema present. No rhinorrhea.     Mouth/Throat:     Pharynx: No oropharyngeal exudate.  Eyes:     General: Lids are normal.     Conjunctiva/sclera: Conjunctivae normal.     Pupils: Pupils are equal, round, and reactive to light.  Neck:     Thyroid: No thyromegaly.     Trachea: Trachea normal. No tracheal deviation.  Cardiovascular:     Rate and Rhythm: Normal rate and regular rhythm.     Heart sounds: Normal heart sounds, S1 normal and S2 normal. No murmur.  Pulmonary:     Effort: Pulmonary effort is normal. No respiratory distress.     Breath sounds: No stridor. No wheezing or rales.  Chest:     Chest wall: No tenderness.  Abdominal:     General: There is no distension.     Palpations: Abdomen is soft. There is no mass.     Tenderness: There is no abdominal tenderness. There is no guarding or rebound.  Musculoskeletal:        General: No tenderness.  Lymphadenopathy:     Head:     Right side of head: No tonsillar adenopathy.     Left side of head: No tonsillar adenopathy.     Cervical: No cervical adenopathy.  Skin:    Coloration: Skin is not pale.     Findings: No erythema or rash.     Nails: There is no clubbing.   Neurological:     Mental Status: She is alert.     Diagnostics: Allergy skin tests were performed.  She did not demonstrate any hypersensitivity against a screening panel of aeroallergens or foods.  Assessment and Plan:    1. Perennial allergic rhinitis  2. LPRD (laryngopharyngeal reflux disease)   3. 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  Allergen avoidance measures  2.  Every night use the following medications sprayed in nose:   A. OTC Afrin - 1 spray single nostril. Alternate nostrils every night  B. OTC Nasacort - 1 spray each nostril  C. Azelastine - 1 spray each nostril  3.  Treat reflux / LPR:   A.  Continue pantoprazole 40 mg - 1 tablet in morning  B.  Start famotidine 40 mg - 1 tablet in the evening   C.  Slowly consolidate all caffeine and chocolate consumption  4.  Does montelukast help?  If not discontinue this medicine  5.  Does oral antihistamine help?  If not discontinue this medicine  6.  Return to clinic in 3 weeks or earlier if problem  Diane appears to have some inflammation of her upper airway that is disturbing her sleep and we will have her utilize the therapy noted above to address this issue.  I do not think we will get into any problems with nasal decongestant induced rebound phenomena as long as she uses Nasacort in conjunction with this topical decongestant.  As well her history is very consistent with LPR which we will address with therapy noted above.  Unfortunately she appears to have dry mouth and dry eye syndrome and I have asked her to try and taper off all oral antihistamines which may help this issue.  I will see her back in this clinic in 3 weeks to assess her response to the approach noted above.  Jiles Prows, MD Allergy / Immunology [facility]

## 2018-12-06 ENCOUNTER — Encounter: Payer: Self-pay | Admitting: Allergy and Immunology

## 2018-12-18 DIAGNOSIS — M4722 Other spondylosis with radiculopathy, cervical region: Secondary | ICD-10-CM | POA: Diagnosis not present

## 2018-12-18 DIAGNOSIS — M9903 Segmental and somatic dysfunction of lumbar region: Secondary | ICD-10-CM | POA: Diagnosis not present

## 2018-12-18 DIAGNOSIS — M9901 Segmental and somatic dysfunction of cervical region: Secondary | ICD-10-CM | POA: Diagnosis not present

## 2018-12-18 DIAGNOSIS — M9902 Segmental and somatic dysfunction of thoracic region: Secondary | ICD-10-CM | POA: Diagnosis not present

## 2018-12-18 DIAGNOSIS — M4135 Thoracogenic scoliosis, thoracolumbar region: Secondary | ICD-10-CM | POA: Diagnosis not present

## 2018-12-18 DIAGNOSIS — M4306 Spondylolysis, lumbar region: Secondary | ICD-10-CM | POA: Diagnosis not present

## 2018-12-26 ENCOUNTER — Other Ambulatory Visit: Payer: Self-pay

## 2018-12-26 ENCOUNTER — Encounter: Payer: Self-pay | Admitting: Allergy and Immunology

## 2018-12-26 ENCOUNTER — Ambulatory Visit (INDEPENDENT_AMBULATORY_CARE_PROVIDER_SITE_OTHER): Payer: Medicare Other | Admitting: Allergy and Immunology

## 2018-12-26 VITALS — BP 136/84 | HR 78 | Temp 98.0°F | Resp 16

## 2018-12-26 DIAGNOSIS — M3500 Sicca syndrome, unspecified: Secondary | ICD-10-CM

## 2018-12-26 DIAGNOSIS — J3089 Other allergic rhinitis: Secondary | ICD-10-CM | POA: Diagnosis not present

## 2018-12-26 DIAGNOSIS — L299 Pruritus, unspecified: Secondary | ICD-10-CM

## 2018-12-26 DIAGNOSIS — K219 Gastro-esophageal reflux disease without esophagitis: Secondary | ICD-10-CM | POA: Diagnosis not present

## 2018-12-26 NOTE — Progress Notes (Signed)
[place]   Follow-up Note  Referring Provider: Nicholos Johns, MD Primary Provider: Nicholos Johns, MD Date of Office Visit: 12/26/2018  Subjective:   Sheryl Ball (DOB: 11-19-1945) is a 73 y.o. female who returns to the [facility] on 12/26/2018 in re-evaluation of the following:  HPI: [place] presents to this clinic in evaluation of her persistent rhinitis and LPR and sicca syndrome.  She was last seen in this clinic on 05 December 2018 for her initial evaluation.  She has had some improvement regarding her nasal congestive issue at nighttime.  She is now able to sleep at night without disturbance.  She certainly feels as though the nostril that she sprays the Afrin into remains more open than the nostril that does not get a spray of Afrin.  She still feels as though her postnasal drip and throat clearing is the same but she has had much less raspy voice.  She has eliminated all caffeine and chocolate consumption.  Since she has eliminated all oral antihistamines her eyes are not as dry but her mouth is still dry.   She does state that her ears get a little bit itchy on occasion.  She does not have any associated vertigo or tinnitus or hearing loss or pain.  Allergies as of 12/26/2018      Reactions   Levaquin [levofloxacin]    Morphine Sulfate Nausea And Vomiting   Prednisone       Medication List    Aleve 220 MG Caps Generic drug: Naproxen Sodium Take by mouth as needed.   aspirin EC 81 MG tablet Take 81 mg by mouth daily.   azelastine 0.1 % nasal spray Commonly known as: ASTELIN Use one spray in each nostril every evening as directed.   baclofen 10 MG tablet Commonly known as: LIORESAL TAKE 1 TABLET BY MOUTH TWICE A DAY AS NEEDED INSTEAD OF TIZANIDINE   Biotin 10000 MCG Tabs Take by mouth daily.   busPIRone 10 MG tablet Commonly known as: BUSPAR Take 10 mg by mouth daily.   CoQ-10 200 MG Caps Take 1  tablet by mouth daily.   donepezil 10 MG tablet Commonly known as: ARICEPT Take 10 mg by mouth daily.   doxycycline 20 MG tablet Commonly known as: PERIOSTAT Take 20 mg by mouth 2 (two) times daily with a meal.   DULoxetine 60 MG capsule Commonly known as: CYMBALTA Take 2 capsules by mouth daily.   famotidine 40 MG tablet Commonly known as: PEPCID Take 1 tablet (40 mg total) by mouth at bedtime.   gabapentin 300 MG capsule Commonly known as: NEURONTIN Take 300 mg by mouth 2 (two) times daily.   KLS Aller-Flo 50 MCG/ACT nasal spray Generic drug: fluticasone Place 1 spray into both nostrils daily.   meclizine 12.5 MG tablet Commonly known as: ANTIVERT Take 1 tablet by mouth daily.   memantine 10 MG tablet Commonly known as: NAMENDA Take 10 mg by mouth daily.   oxybutynin 10 MG 24 hr tablet Commonly known as: DITROPAN-XL Take 10 mg by mouth daily.   pantoprazole 40 MG tablet Commonly known as: PROTONIX Take 1 tablet (40 mg total) by mouth daily.   rosuvastatin 40 MG tablet Commonly known as: CRESTOR TAKE 1 TABLET BY MOUTH EVERYDAY AT BEDTIME   traZODone 50 MG tablet Commonly known as: DESYREL Take 125 mg by mouth daily. Take 2.5 tablets everyday   TYLENOL PO Take by mouth as  needed.   vitamin B-12 1000 MCG tablet Commonly known as: CYANOCOBALAMIN Take by mouth.   Vitamin D (Ergocalciferol) 1.25 MG (50000 UT) Caps capsule Commonly known as: DRISDOL TAKE 1 CAPSULE ONE TIME PER WEEK   VITAMIN D3 PO Take by mouth.       Past Medical History:  Diagnosis Date  . Anxiety   . Arthritis   . Depression   . GERD (gastroesophageal reflux disease)   . Hypercholesteremia   . IBS (irritable bowel syndrome)   . Mitral valve prolapse   . Osteoarthritis   . Panic disorder     Past Surgical History:  Procedure Laterality Date  . ABDOMINAL HYSTERECTOMY     30 years ago  . BACK SURGERY     C5-C7 fusion  . BLADDER SURGERY     bladder lifted twice  .  COLONOSCOPY  11/09/2012   Moderate internal hemorrhoids. Mild sigmoid diverticulosis. THe colon was highly redundant.   . COLONOSCOPY WITH ESOPHAGOGASTRODUODENOSCOPY (EGD)    . ESOPHAGOGASTRODUODENOSCOPY  03/10/2010   Schatzkis ring status post dilitation. Small hiatal hernia.   Marland Kitchen HERNIA REPAIR  1998  . TONSILLECTOMY      Review of systems negative except as noted in HPI / PMHx or noted below:  Review of Systems  Constitutional: Negative.   HENT: Negative.   Eyes: Negative.   Respiratory: Negative.   Cardiovascular: Negative.   Gastrointestinal: Negative.   Genitourinary: Negative.   Musculoskeletal: Negative.   Skin: Negative.   Neurological: Negative.   Endo/Heme/Allergies: Negative.   Psychiatric/Behavioral: Negative.      Objective:   Vitals:   12/26/18 1506  BP: 136/84  Pulse: 78  Resp: 16  Temp: 98 F (36.7 C)  SpO2: 97%          Physical Exam Constitutional:      Appearance: She is not diaphoretic.  HENT:     Head: Normocephalic.     Right Ear: Tympanic membrane, ear canal and external ear normal.     Left Ear: Tympanic membrane, ear canal and external ear normal.     Nose: Nose normal. No mucosal edema or rhinorrhea.     Mouth/Throat:     Pharynx: Uvula midline. No oropharyngeal exudate.  Eyes:     Conjunctiva/sclera: Conjunctivae normal.  Neck:     Thyroid: No thyromegaly.     Trachea: Trachea normal. No tracheal tenderness or tracheal deviation.  Cardiovascular:     Rate and Rhythm: Normal rate and regular rhythm.     Heart sounds: Normal heart sounds, S1 normal and S2 normal. No murmur.  Pulmonary:     Effort: No respiratory distress.     Breath sounds: Normal breath sounds. No stridor. No wheezing or rales.  Lymphadenopathy:     Head:     Right side of head: No tonsillar adenopathy.     Left side of head: No tonsillar adenopathy.     Cervical: No cervical adenopathy.  Skin:    Findings: No erythema or rash.     Nails: There is no  clubbing.   Neurological:     Mental Status: She is alert.     Diagnostics:   Assessment and Plan:   1. Perennial allergic rhinitis   2. LPRD (laryngopharyngeal reflux disease)   3. Sicca syndrome ([facility])   4. Ear itching     1.  Continue to use the following medications sprayed in nose:   A. OTC Afrin - 1 spray single nostril. Alternate nostrils every  night  B. OTC Nasacort - 1 spray each nostril 2 times a day  C. Azelastine - 1 spray each nostril 2 times a day  3.  Continue to Treat reflux / LPR:   A.  pantoprazole 40 mg - 1 tablet in morning  B.  famotidine 40 mg - 1 tablet in the evening  4. If needed:   A. Nasal saline wash    B. OTC hydrocortisone cream to ears a few times per week  5. Obtain fall flu vaccine (and COVID vaccine)   6.  Return to clinic in 9 weeks or earlier if problem  Francisquita is better. I would like keep her on this plan for full 12 weeks thus I will see her back in this clinic in 9 weeks.  We will have her increase her dose of Nasacort and azelastine while she remains on Afrin 1 spray single nostril alternate nostrils every night.  She will also continue to aggressively treat her reflux as noted above.  Allena Katz, MD Allergy / Immunology [facility]

## 2018-12-26 NOTE — Patient Instructions (Addendum)
  1.  Continue to use the following medications sprayed in nose:   A. OTC Afrin - 1 spray single nostril. Alternate nostrils every night  B. OTC Nasacort - 1 spray each nostril 2 times a day  C. Azelastine - 1 spray each nostril 2 times a day  3.  Continue to Treat reflux / LPR:   A.  pantoprazole 40 mg - 1 tablet in morning  B.  famotidine 40 mg - 1 tablet in the evening  4. If needed:   A. Nasal saline wash    B. OTC hydrocortisone cream to ears a few times per week  5. Obtain fall flu vaccine (and COVID vaccine)   6.  Return to clinic in 9 weeks or earlier if problem

## 2018-12-27 ENCOUNTER — Encounter: Payer: Self-pay | Admitting: Allergy and Immunology

## 2018-12-27 ENCOUNTER — Other Ambulatory Visit: Payer: Self-pay | Admitting: Gastroenterology

## 2018-12-27 DIAGNOSIS — K219 Gastro-esophageal reflux disease without esophagitis: Secondary | ICD-10-CM

## 2019-01-01 DIAGNOSIS — M9902 Segmental and somatic dysfunction of thoracic region: Secondary | ICD-10-CM | POA: Diagnosis not present

## 2019-01-01 DIAGNOSIS — M4306 Spondylolysis, lumbar region: Secondary | ICD-10-CM | POA: Diagnosis not present

## 2019-01-01 DIAGNOSIS — M9903 Segmental and somatic dysfunction of lumbar region: Secondary | ICD-10-CM | POA: Diagnosis not present

## 2019-01-01 DIAGNOSIS — M4135 Thoracogenic scoliosis, thoracolumbar region: Secondary | ICD-10-CM | POA: Diagnosis not present

## 2019-01-01 DIAGNOSIS — M4722 Other spondylosis with radiculopathy, cervical region: Secondary | ICD-10-CM | POA: Diagnosis not present

## 2019-01-01 DIAGNOSIS — M9901 Segmental and somatic dysfunction of cervical region: Secondary | ICD-10-CM | POA: Diagnosis not present

## 2019-01-15 DIAGNOSIS — M9903 Segmental and somatic dysfunction of lumbar region: Secondary | ICD-10-CM | POA: Diagnosis not present

## 2019-01-15 DIAGNOSIS — M4726 Other spondylosis with radiculopathy, lumbar region: Secondary | ICD-10-CM | POA: Diagnosis not present

## 2019-01-15 DIAGNOSIS — M4724 Other spondylosis with radiculopathy, thoracic region: Secondary | ICD-10-CM | POA: Diagnosis not present

## 2019-01-15 DIAGNOSIS — M9902 Segmental and somatic dysfunction of thoracic region: Secondary | ICD-10-CM | POA: Diagnosis not present

## 2019-01-15 DIAGNOSIS — M9905 Segmental and somatic dysfunction of pelvic region: Secondary | ICD-10-CM | POA: Diagnosis not present

## 2019-01-15 DIAGNOSIS — M4728 Other spondylosis with radiculopathy, sacral and sacrococcygeal region: Secondary | ICD-10-CM | POA: Diagnosis not present

## 2019-01-24 DIAGNOSIS — M549 Dorsalgia, unspecified: Secondary | ICD-10-CM | POA: Diagnosis not present

## 2019-01-24 DIAGNOSIS — M159 Polyosteoarthritis, unspecified: Secondary | ICD-10-CM | POA: Diagnosis not present

## 2019-01-24 DIAGNOSIS — Z79899 Other long term (current) drug therapy: Secondary | ICD-10-CM | POA: Diagnosis not present

## 2019-01-24 DIAGNOSIS — G8929 Other chronic pain: Secondary | ICD-10-CM | POA: Diagnosis not present

## 2019-01-28 DIAGNOSIS — M9903 Segmental and somatic dysfunction of lumbar region: Secondary | ICD-10-CM | POA: Diagnosis not present

## 2019-01-28 DIAGNOSIS — M4726 Other spondylosis with radiculopathy, lumbar region: Secondary | ICD-10-CM | POA: Diagnosis not present

## 2019-01-28 DIAGNOSIS — M4728 Other spondylosis with radiculopathy, sacral and sacrococcygeal region: Secondary | ICD-10-CM | POA: Diagnosis not present

## 2019-01-28 DIAGNOSIS — M9902 Segmental and somatic dysfunction of thoracic region: Secondary | ICD-10-CM | POA: Diagnosis not present

## 2019-01-28 DIAGNOSIS — M9905 Segmental and somatic dysfunction of pelvic region: Secondary | ICD-10-CM | POA: Diagnosis not present

## 2019-01-28 DIAGNOSIS — M4724 Other spondylosis with radiculopathy, thoracic region: Secondary | ICD-10-CM | POA: Diagnosis not present

## 2019-02-01 DIAGNOSIS — Z23 Encounter for immunization: Secondary | ICD-10-CM | POA: Diagnosis not present

## 2019-02-01 DIAGNOSIS — R7989 Other specified abnormal findings of blood chemistry: Secondary | ICD-10-CM | POA: Diagnosis not present

## 2019-02-01 DIAGNOSIS — R739 Hyperglycemia, unspecified: Secondary | ICD-10-CM | POA: Diagnosis not present

## 2019-02-01 DIAGNOSIS — Z79899 Other long term (current) drug therapy: Secondary | ICD-10-CM | POA: Diagnosis not present

## 2019-02-01 DIAGNOSIS — E559 Vitamin D deficiency, unspecified: Secondary | ICD-10-CM | POA: Diagnosis not present

## 2019-02-01 DIAGNOSIS — E785 Hyperlipidemia, unspecified: Secondary | ICD-10-CM | POA: Diagnosis not present

## 2019-02-13 DIAGNOSIS — Z780 Asymptomatic menopausal state: Secondary | ICD-10-CM | POA: Diagnosis not present

## 2019-02-13 DIAGNOSIS — M818 Other osteoporosis without current pathological fracture: Secondary | ICD-10-CM | POA: Diagnosis not present

## 2019-02-14 DIAGNOSIS — M4726 Other spondylosis with radiculopathy, lumbar region: Secondary | ICD-10-CM | POA: Diagnosis not present

## 2019-02-14 DIAGNOSIS — M9903 Segmental and somatic dysfunction of lumbar region: Secondary | ICD-10-CM | POA: Diagnosis not present

## 2019-02-14 DIAGNOSIS — M4724 Other spondylosis with radiculopathy, thoracic region: Secondary | ICD-10-CM | POA: Diagnosis not present

## 2019-02-14 DIAGNOSIS — M9905 Segmental and somatic dysfunction of pelvic region: Secondary | ICD-10-CM | POA: Diagnosis not present

## 2019-02-14 DIAGNOSIS — M4728 Other spondylosis with radiculopathy, sacral and sacrococcygeal region: Secondary | ICD-10-CM | POA: Diagnosis not present

## 2019-02-14 DIAGNOSIS — M9902 Segmental and somatic dysfunction of thoracic region: Secondary | ICD-10-CM | POA: Diagnosis not present

## 2019-02-22 DIAGNOSIS — Z1231 Encounter for screening mammogram for malignant neoplasm of breast: Secondary | ICD-10-CM | POA: Diagnosis not present

## 2019-02-26 DIAGNOSIS — M4722 Other spondylosis with radiculopathy, cervical region: Secondary | ICD-10-CM | POA: Diagnosis not present

## 2019-02-26 DIAGNOSIS — M4145 Neuromuscular scoliosis, thoracolumbar region: Secondary | ICD-10-CM | POA: Diagnosis not present

## 2019-02-26 DIAGNOSIS — M9901 Segmental and somatic dysfunction of cervical region: Secondary | ICD-10-CM | POA: Diagnosis not present

## 2019-02-26 DIAGNOSIS — M9902 Segmental and somatic dysfunction of thoracic region: Secondary | ICD-10-CM | POA: Diagnosis not present

## 2019-02-26 DIAGNOSIS — M4724 Other spondylosis with radiculopathy, thoracic region: Secondary | ICD-10-CM | POA: Diagnosis not present

## 2019-02-26 DIAGNOSIS — M9903 Segmental and somatic dysfunction of lumbar region: Secondary | ICD-10-CM | POA: Diagnosis not present

## 2019-02-27 ENCOUNTER — Ambulatory Visit (INDEPENDENT_AMBULATORY_CARE_PROVIDER_SITE_OTHER): Payer: Medicare Other | Admitting: Allergy and Immunology

## 2019-02-27 ENCOUNTER — Other Ambulatory Visit: Payer: Self-pay

## 2019-02-27 ENCOUNTER — Encounter: Payer: Self-pay | Admitting: Allergy and Immunology

## 2019-02-27 VITALS — BP 118/68 | HR 65 | Temp 98.0°F | Resp 16

## 2019-02-27 DIAGNOSIS — K219 Gastro-esophageal reflux disease without esophagitis: Secondary | ICD-10-CM

## 2019-02-27 DIAGNOSIS — J3089 Other allergic rhinitis: Secondary | ICD-10-CM | POA: Diagnosis not present

## 2019-02-27 DIAGNOSIS — T443X5D Adverse effect of other parasympatholytics [anticholinergics and antimuscarinics] and spasmolytics, subsequent encounter: Secondary | ICD-10-CM

## 2019-02-27 DIAGNOSIS — M3500 Sicca syndrome, unspecified: Secondary | ICD-10-CM | POA: Diagnosis not present

## 2019-02-27 NOTE — Patient Instructions (Addendum)
  1.  Continue to use the following medications sprayed in nose:   A. OTC Nasacort - 1 spray each nostril 1-2 times a day  C. Azelastine - 1 spray each nostril 1-2 times a day  2. Can add OTC Afrin - 1 spray single nostril at night if needed. Alternate nostrils every night  3.  Continue to Treat reflux / LPR:   A.  pantoprazole 40 mg - 1 tablet in morning  B.  famotidine 40 mg - 1 tablet in the evening  4. If needed:   A. Nasal saline wash    B. Olive oil to ears a few times per week  5. Evaluation with ENT for LPR  6. Obtain COVID vaccine when available  7. Remain away from use of oral antihistamine. Ditropan (Oxybutynin)?  8.  Return to clinic in 12 weeks or earlier if problem

## 2019-02-27 NOTE — Progress Notes (Signed)
[place]   Follow-up Note  Referring Provider: Nicholos Johns, MD Primary Provider: Nicholos Johns, MD Date of Office Visit: 02/27/2019  Subjective:   Sheryl Ball (DOB: [birth year]) is a 73 y.o. female who returns to the [facility] on 02/27/2019 in re-evaluation of the following:  HPI: Sheryl Ball presents to this clinic in evaluation of rhinitis and LPR and sicca syndrome.  Her last visit to this clinic was 26 December 2018.  She is currently using a nasal steroid and a nasal antihistamine mostly 1 time per day and occasionally Afrin to a single nostril.  Using this plan she feels as though her upper airway is really doing a lot better.  She can sleep through the night now with an open airway without any difficulty.  She still has drainage and postnasal drip and throat clearing.  She has resolved all of her hoarseness.  She is using a proton pump inhibitor once a day and frequently forgets to use famotidine in the evening.  Sheryl Ball tells me that she has an issue occasionally with food getting hung up into her chest that can stay there for several minutes.  She has seen Dr. Lyndel Safe for this issue in the past.  She was told that she has a hiatal hernia.  She blames a lot of the swallowing problem on the fact that she is extremely dry in her mouth.  She does remain away from using oral antihistamines.  She is using Ditropan.  She did obtain the flu vaccine.  Allergies as of 02/27/2019      Reactions   Morphine Sulfate Nausea And Vomiting   Prednisone       Medication List      alendronate 70 MG tablet Commonly known as: FOSAMAX   Aleve 220 MG Caps Generic drug: Naproxen Sodium Take by mouth as needed.   aspirin EC 81 MG tablet Take 81 mg by mouth daily.   azelastine 0.1 % nasal spray Commonly known as: ASTELIN Use one spray in each nostril every evening as directed.   baclofen 10 MG tablet Commonly known as: LIORESAL  TAKE 1 TABLET BY MOUTH TWICE A DAY AS NEEDED INSTEAD OF TIZANIDINE   Biotin 10000 MCG Tabs Take by mouth daily.   busPIRone 10 MG tablet Commonly known as: BUSPAR Take 10 mg by mouth daily.   CoQ-10 200 MG Caps Take 1 tablet by mouth daily.   donepezil 10 MG tablet Commonly known as: ARICEPT Take 10 mg by mouth daily.   doxycycline 20 MG tablet Commonly known as: PERIOSTAT Take 20 mg by mouth 2 (two) times daily with a meal.   DULoxetine 60 MG capsule Commonly known as: CYMBALTA Take 2 capsules by mouth daily.   famotidine 40 MG tablet Commonly known as: PEPCID Take 1 tablet (40 mg total) by mouth at bedtime.   gabapentin 300 MG capsule Commonly known as: NEURONTIN Take 300 mg by mouth 2 (two) times daily.   KLS Aller-Flo 50 MCG/ACT nasal spray Generic drug: fluticasone Place 1 spray into both nostrils daily.   meclizine 12.5 MG tablet Commonly known as: ANTIVERT Take 1 tablet by mouth daily.   memantine 10 MG tablet Commonly known as: NAMENDA Take 10 mg by mouth daily.   montelukast 10 MG tablet Commonly known as: SINGULAIR   oxybutynin 10 MG 24 hr tablet Commonly known as: DITROPAN-XL Take 10 mg by mouth daily.   pantoprazole 40 MG tablet Commonly  known as: PROTONIX Take 1 tablet (40 mg total) by mouth daily.   rosuvastatin 40 MG tablet Commonly known as: CRESTOR TAKE 1 TABLET BY MOUTH EVERYDAY AT BEDTIME   traZODone 50 MG tablet Commonly known as: DESYREL Take 125 mg by mouth daily. Take 2.5 tablets everyday   TYLENOL PO Take by mouth as needed.   vitamin B-12 1000 MCG tablet Commonly known as: CYANOCOBALAMIN Take by mouth.   Vitamin D (Ergocalciferol) 1.25 MG (50000 UT) Caps capsule Commonly known as: DRISDOL TAKE 1 CAPSULE ONE TIME PER WEEK   VITAMIN D3 PO Take by mouth.       Past Medical History:  Diagnosis Date  . Anxiety   . Arthritis   . Depression   . GERD (gastroesophageal reflux disease)   . Hypercholesteremia   .  IBS (irritable bowel syndrome)   . Mitral valve prolapse   . Osteoarthritis   . Panic disorder     Past Surgical History:  Procedure Laterality Date  . ABDOMINAL HYSTERECTOMY     30 years ago  . BACK SURGERY     C5-C7 fusion  . BLADDER SURGERY     bladder lifted twice  . COLONOSCOPY  11/09/2012   Moderate internal hemorrhoids. Mild sigmoid diverticulosis. THe colon was highly redundant.   . COLONOSCOPY WITH ESOPHAGOGASTRODUODENOSCOPY (EGD)    . ESOPHAGOGASTRODUODENOSCOPY  03/10/2010   Schatzkis ring status post dilitation. Small hiatal hernia.   Marland Kitchen HERNIA REPAIR  1998  . TONSILLECTOMY      Review of systems negative except as noted in HPI / PMHx or noted below:  Review of Systems  Constitutional: Negative.   HENT: Negative.   Eyes: Negative.   Respiratory: Negative.   Cardiovascular: Negative.   Gastrointestinal: Negative.   Genitourinary: Negative.   Musculoskeletal: Negative.   Skin: Negative.   Neurological: Negative.   Endo/Heme/Allergies: Negative.   Psychiatric/Behavioral: Negative.      Objective:   Vitals:   02/27/19 1349  BP: 118/68  Pulse: 65  Resp: 16  Temp: 98 F (36.7 C)  SpO2: 97%          Physical Exam Constitutional:      Appearance: She is not diaphoretic.  HENT:     Head: Normocephalic.     Right Ear: Tympanic membrane, ear canal and external ear normal.     Left Ear: Tympanic membrane, ear canal and external ear normal.     Nose: Nose normal. No mucosal edema or rhinorrhea.     Mouth/Throat:     Pharynx: Uvula midline. No oropharyngeal exudate.  Eyes:     Conjunctiva/sclera: Conjunctivae normal.  Neck:     Thyroid: No thyromegaly.     Trachea: Trachea normal. No tracheal tenderness or tracheal deviation.  Cardiovascular:     Rate and Rhythm: Normal rate and regular rhythm.     Heart sounds: Normal heart sounds, S1 normal and S2 normal. No murmur.  Pulmonary:     Effort: No respiratory distress.     Breath sounds: Normal  breath sounds. No stridor. No wheezing or rales.  Lymphadenopathy:     Head:     Right side of head: No tonsillar adenopathy.     Left side of head: No tonsillar adenopathy.     Cervical: No cervical adenopathy.  Skin:    Findings: No erythema or rash.     Nails: There is no clubbing.   Neurological:     Mental Status: She is alert.  Diagnostics: none  Assessment and Plan:   1. Perennial allergic rhinitis   2. LPRD (laryngopharyngeal reflux disease)   3. Sicca syndrome ([place])   4. Adverse effect of oxybutynin, subsequent encounter     1.  Continue to use the following medications sprayed in nose:   A. OTC Nasacort - 1 spray each nostril 1-2 times a day  C. Azelastine - 1 spray each nostril 1-2 times a day  2. Can add OTC Afrin - 1 spray single nostril at night if needed. Alternate nostrils every night  3.  Continue to Treat reflux / LPR:   A.  pantoprazole 40 mg - 1 tablet in morning  B.  famotidine 40 mg - 1 tablet in the evening  4. If needed:   A. Nasal saline wash    B. Olive oil to ears a few times per week  5. Evaluation with ENT for LPR  6. Obtain COVID vaccine when available  7. Remain away from use of oral antihistamine. Ditropan (Oxybutynin)?  8.  Return to clinic in 12 weeks or earlier if problem  Sheryl Ball appears to be doing very well with her upper airways.  She still appears to have some throat issues.  These throat issues may be secondary to LPR but to be complete we will refer her to ENT to have a look at her throat.  Her sicca syndrome may also be playing a role in the sensation of a glob in her throat and throat clearing.  It should be noted that she is using Ditropan which is no doubt giving rise to some degree of dryness given its anticholinergic effect.  I have asked her to discuss with her primary care doctor if Ditropan is the best choice for her regarding bladder overactivity given her sicca syndrome.  I will see her back in this clinic in 12  weeks or earlier if there is a problem.  Allena Katz, MD Allergy / Immunology [place]

## 2019-02-28 ENCOUNTER — Encounter: Payer: Self-pay | Admitting: Allergy and Immunology

## 2019-03-12 DIAGNOSIS — M9901 Segmental and somatic dysfunction of cervical region: Secondary | ICD-10-CM | POA: Diagnosis not present

## 2019-03-12 DIAGNOSIS — M4722 Other spondylosis with radiculopathy, cervical region: Secondary | ICD-10-CM | POA: Diagnosis not present

## 2019-03-12 DIAGNOSIS — M9903 Segmental and somatic dysfunction of lumbar region: Secondary | ICD-10-CM | POA: Diagnosis not present

## 2019-03-12 DIAGNOSIS — M9902 Segmental and somatic dysfunction of thoracic region: Secondary | ICD-10-CM | POA: Diagnosis not present

## 2019-03-12 DIAGNOSIS — M4724 Other spondylosis with radiculopathy, thoracic region: Secondary | ICD-10-CM | POA: Diagnosis not present

## 2019-03-12 DIAGNOSIS — M4145 Neuromuscular scoliosis, thoracolumbar region: Secondary | ICD-10-CM | POA: Diagnosis not present

## 2019-03-26 DIAGNOSIS — Z20828 Contact with and (suspected) exposure to other viral communicable diseases: Secondary | ICD-10-CM | POA: Diagnosis not present

## 2019-03-26 DIAGNOSIS — J029 Acute pharyngitis, unspecified: Secondary | ICD-10-CM | POA: Diagnosis not present

## 2019-03-26 DIAGNOSIS — R110 Nausea: Secondary | ICD-10-CM | POA: Diagnosis not present

## 2019-03-29 DIAGNOSIS — J3489 Other specified disorders of nose and nasal sinuses: Secondary | ICD-10-CM | POA: Diagnosis not present

## 2019-03-29 DIAGNOSIS — R0981 Nasal congestion: Secondary | ICD-10-CM | POA: Diagnosis not present

## 2019-03-29 DIAGNOSIS — H938X9 Other specified disorders of ear, unspecified ear: Secondary | ICD-10-CM | POA: Diagnosis not present

## 2019-03-29 DIAGNOSIS — Z889 Allergy status to unspecified drugs, medicaments and biological substances status: Secondary | ICD-10-CM | POA: Diagnosis not present

## 2019-03-29 DIAGNOSIS — J342 Deviated nasal septum: Secondary | ICD-10-CM | POA: Diagnosis not present

## 2019-04-25 DIAGNOSIS — M9903 Segmental and somatic dysfunction of lumbar region: Secondary | ICD-10-CM | POA: Diagnosis not present

## 2019-04-25 DIAGNOSIS — M4724 Other spondylosis with radiculopathy, thoracic region: Secondary | ICD-10-CM | POA: Diagnosis not present

## 2019-04-25 DIAGNOSIS — M9901 Segmental and somatic dysfunction of cervical region: Secondary | ICD-10-CM | POA: Diagnosis not present

## 2019-04-25 DIAGNOSIS — M9902 Segmental and somatic dysfunction of thoracic region: Secondary | ICD-10-CM | POA: Diagnosis not present

## 2019-04-25 DIAGNOSIS — M5416 Radiculopathy, lumbar region: Secondary | ICD-10-CM | POA: Diagnosis not present

## 2019-04-25 DIAGNOSIS — M47892 Other spondylosis, cervical region: Secondary | ICD-10-CM | POA: Diagnosis not present

## 2019-05-07 DIAGNOSIS — M5416 Radiculopathy, lumbar region: Secondary | ICD-10-CM | POA: Diagnosis not present

## 2019-05-07 DIAGNOSIS — M9901 Segmental and somatic dysfunction of cervical region: Secondary | ICD-10-CM | POA: Diagnosis not present

## 2019-05-07 DIAGNOSIS — M4724 Other spondylosis with radiculopathy, thoracic region: Secondary | ICD-10-CM | POA: Diagnosis not present

## 2019-05-07 DIAGNOSIS — M9903 Segmental and somatic dysfunction of lumbar region: Secondary | ICD-10-CM | POA: Diagnosis not present

## 2019-05-07 DIAGNOSIS — M47892 Other spondylosis, cervical region: Secondary | ICD-10-CM | POA: Diagnosis not present

## 2019-05-07 DIAGNOSIS — M9902 Segmental and somatic dysfunction of thoracic region: Secondary | ICD-10-CM | POA: Diagnosis not present

## 2019-05-17 DIAGNOSIS — G43109 Migraine with aura, not intractable, without status migrainosus: Secondary | ICD-10-CM | POA: Diagnosis not present

## 2019-05-17 DIAGNOSIS — R531 Weakness: Secondary | ICD-10-CM | POA: Diagnosis not present

## 2019-05-17 DIAGNOSIS — J019 Acute sinusitis, unspecified: Secondary | ICD-10-CM | POA: Diagnosis not present

## 2019-05-17 DIAGNOSIS — Z20828 Contact with and (suspected) exposure to other viral communicable diseases: Secondary | ICD-10-CM | POA: Diagnosis not present

## 2019-05-20 DIAGNOSIS — H8303 Labyrinthitis, bilateral: Secondary | ICD-10-CM | POA: Diagnosis not present

## 2019-05-20 DIAGNOSIS — J309 Allergic rhinitis, unspecified: Secondary | ICD-10-CM | POA: Diagnosis not present

## 2019-05-20 DIAGNOSIS — R42 Dizziness and giddiness: Secondary | ICD-10-CM | POA: Diagnosis not present

## 2019-05-20 DIAGNOSIS — H68003 Unspecified Eustachian salpingitis, bilateral: Secondary | ICD-10-CM | POA: Diagnosis not present

## 2019-05-22 ENCOUNTER — Other Ambulatory Visit: Payer: Self-pay

## 2019-05-22 ENCOUNTER — Encounter: Payer: Self-pay | Admitting: Allergy and Immunology

## 2019-05-22 ENCOUNTER — Ambulatory Visit (INDEPENDENT_AMBULATORY_CARE_PROVIDER_SITE_OTHER): Payer: Medicare Other | Admitting: Allergy and Immunology

## 2019-05-22 VITALS — BP 140/78 | HR 52 | Temp 98.5°F | Resp 12

## 2019-05-22 DIAGNOSIS — H8393 Unspecified disease of inner ear, bilateral: Secondary | ICD-10-CM | POA: Diagnosis not present

## 2019-05-22 DIAGNOSIS — J3089 Other allergic rhinitis: Secondary | ICD-10-CM | POA: Diagnosis not present

## 2019-05-22 DIAGNOSIS — K219 Gastro-esophageal reflux disease without esophagitis: Secondary | ICD-10-CM

## 2019-05-22 DIAGNOSIS — M3500 Sicca syndrome, unspecified: Secondary | ICD-10-CM | POA: Diagnosis not present

## 2019-05-22 MED ORDER — FAMOTIDINE 40 MG PO TABS: 40.0000 mg | ORAL_TABLET | Freq: Every day | ORAL | 5 refills | Status: DC

## 2019-05-22 NOTE — Patient Instructions (Addendum)
  1.  Continue to use the following medications sprayed in nose:   A. OTC Nasacort OR Flonase - 1 spray each nostril 1-2 times a day  C. Azelastine - 1 spray each nostril 1-2 times a day  2.  Continue to Treat reflux / LPR:   A.  pantoprazole 40 mg - 1 tablet in morning  B.  famotidine 40 mg - 1 tablet in the evening  3. Stop doxycycline, Fioricet, oral antihistamines  4. If needed:   A. Nasal saline wash    B. Olive oil to ears a few times per week  C. OTC Ibuprofen  D. Systane eyedrops  5. Further evaluation? Yes, if still with vertigo, or double vision.  6. Obtain COVID vaccine when available  7. Return to clinic in 12 weeks or earlier if problem

## 2019-05-22 NOTE — Progress Notes (Signed)
[place]   Follow-up Note  Referring Provider: Nicholos Johns, MD Primary Provider: Nicholos Johns, MD Date of Office Visit: 05/22/2019  Subjective:   Sheryl Ball (DOB: Aug 26, 1945) is a 74 y.o. female who returns to the [facility] on 05/22/2019 in re-evaluation of the following:  HPI: Demetris presents to this clinic in evaluation of rhinitis and LPR and history of sicca syndrome.  Her last visit to this clinic was 27 February 2019.  She was doing relatively well regarding her nasal and throat issue with very little nasal congestion and ability to sleep through the night without much difficulty and very little drainage and throat clearing while she used a collection of medical therapy directed against inflammation of her airway and reflux.  She did visit with Dr. Gaylyn Cheers, ENT, on 29 March 2019 but a rhinoscopic exam of her [place] was not performed.  Something happened 6 days ago.  She left the house to run some errands and she had acute onset of vertigo and double vision along with lots of head pressure and nausea.  Her husband took her home and she apparently went to the urgent care center and it was felt that she may have had an infection and she was given doxycycline and Fioricet which she has been using consistently since onset of this event.  Currently, her double vision has resolved and her pressure in her head is better but she still a little nauseated and a little dizzy.  She did not have any other associated neurological symptoms with this issue.  She did not have a fever or ugly nasal discharge or any symptoms to suggest an infectious disease.  She subsequently contacted her primary care doctor who suggested that she use either Allegra or Claritin for this issue.  She still remains with rather significant dry nose and eyes.  Since this event has occurred her eyes have really been stinging and they are extremely  dry.  Allergies as of 05/22/2019      Reactions   Morphine Sulfate Nausea And Vomiting   Prednisone       Medication List    alendronate 70 MG tablet Commonly known as: FOSAMAX   Aleve 220 MG Caps Generic drug: Naproxen Sodium Take by mouth as needed.   aspirin EC 81 MG tablet Take 81 mg by mouth daily.   azelastine 0.1 % nasal spray Commonly known as: ASTELIN Use one spray in each nostril every evening as directed.   baclofen 10 MG tablet Commonly known as: LIORESAL TAKE 1 TABLET BY MOUTH TWICE A DAY AS NEEDED INSTEAD OF TIZANIDINE   Biotin 10000 MCG Tabs Take by mouth daily.   busPIRone 10 MG tablet Commonly known as: BUSPAR Take 10 mg by mouth daily.   CoQ-10 200 MG Caps Take 1 tablet by mouth daily.   donepezil 10 MG tablet Commonly known as: ARICEPT Take 10 mg by mouth daily.   doxycycline 20 MG tablet Commonly known as: PERIOSTAT Take 20 mg by mouth 2 (two) times daily with a meal.   DULoxetine 60 MG capsule Commonly known as: CYMBALTA Take 2 capsules by mouth daily.   famotidine 40 MG tablet Commonly known as: PEPCID Take 1 tablet (40 mg total) by mouth at bedtime.   gabapentin 300 MG capsule Commonly known as: NEURONTIN Take 300 mg by mouth 2 (two) times daily.   meclizine 12.5 MG tablet Commonly known as: ANTIVERT Take 1 tablet by  mouth daily.   memantine 10 MG tablet Commonly known as: NAMENDA Take 10 mg by mouth daily.   Nasacort Allergy 24HR 55 MCG/ACT Aero nasal inhaler Generic drug: triamcinolone Place 1 spray into the nose daily.   oxybutynin 10 MG 24 hr tablet Commonly known as: DITROPAN-XL Take 10 mg by mouth daily.   pantoprazole 40 MG tablet Commonly known as: PROTONIX Take 1 tablet (40 mg total) by mouth daily.   rosuvastatin 40 MG tablet Commonly known as: CRESTOR TAKE 1 TABLET BY MOUTH EVERYDAY AT BEDTIME   traZODone 50 MG tablet Commonly known as: DESYREL Take 125 mg by mouth daily. Take 2.5 tablets everyday    TYLENOL PO Take by mouth as needed.   vitamin B-12 1000 MCG tablet Commonly known as: CYANOCOBALAMIN Take by mouth.   Vitamin D (Ergocalciferol) 1.25 MG (50000 UNIT) Caps capsule Commonly known as: DRISDOL TAKE 1 CAPSULE ONE TIME PER WEEK   VITAMIN D3 PO Take by mouth.       Past Medical History:  Diagnosis Date  . Anxiety   . Arthritis   . Depression   . GERD (gastroesophageal reflux disease)   . Hypercholesteremia   . IBS (irritable bowel syndrome)   . Mitral valve prolapse   . Osteoarthritis   . Panic disorder     Past Surgical History:  Procedure Laterality Date  . ABDOMINAL HYSTERECTOMY     30 years ago  . BACK SURGERY     C5-C7 fusion  . BLADDER SURGERY     bladder lifted twice  . COLONOSCOPY  11/09/2012   Moderate internal hemorrhoids. Mild sigmoid diverticulosis. THe colon was highly redundant.   . COLONOSCOPY WITH ESOPHAGOGASTRODUODENOSCOPY (EGD)    . ESOPHAGOGASTRODUODENOSCOPY  03/10/2010   Schatzkis ring status post dilitation. Small hiatal hernia.   Marland Kitchen HERNIA REPAIR  1998  . TONSILLECTOMY      Review of systems negative except as noted in HPI / PMHx or noted below:  Review of Systems  Constitutional: Negative.   HENT: Negative.   Eyes: Negative.   Respiratory: Negative.   Cardiovascular: Negative.   Gastrointestinal: Negative.   Genitourinary: Negative.   Musculoskeletal: Negative.   Skin: Negative.   Neurological: Negative.   Endo/Heme/Allergies: Negative.   Psychiatric/Behavioral: Negative.      Objective:   Vitals:   05/22/19 1344  BP: 140/78  Pulse: (!) 52  Resp: 12  Temp: 98.5 F (36.9 C)  SpO2: 98%          Physical Exam Constitutional:      Appearance: She is not diaphoretic.  HENT:     Head: Normocephalic.     Right Ear: Tympanic membrane, ear canal and external ear normal.     Left Ear: Tympanic membrane, ear canal and external ear normal.     Nose: Nose normal. No mucosal edema or rhinorrhea.      Mouth/Throat:     Pharynx: Uvula midline. No oropharyngeal exudate.  Eyes:     Conjunctiva/sclera: Conjunctivae normal.  Neck:     Thyroid: No thyromegaly.     Trachea: Trachea normal. No tracheal tenderness or tracheal deviation.  Cardiovascular:     Rate and Rhythm: Normal rate and regular rhythm.     Heart sounds: Normal heart sounds, S1 normal and S2 normal. No murmur.  Pulmonary:     Effort: No respiratory distress.     Breath sounds: Normal breath sounds. No stridor. No wheezing or rales.  Lymphadenopathy:     Head:  Right side of head: No tonsillar adenopathy.     Left side of head: No tonsillar adenopathy.     Cervical: No cervical adenopathy.  Skin:    Findings: No erythema or rash.     Nails: There is no clubbing.  Neurological:     Mental Status: She is alert.     Diagnostics: none  Assessment and Plan:   1. Inner ear dysfunction, bilateral   2. Perennial allergic rhinitis   3. LPRD (laryngopharyngeal reflux disease)   4. Sicca syndrome ([place])     1.  Continue to use the following medications sprayed in nose:   A. OTC Nasacort OR Flonase - 1 spray each nostril 1-2 times a day  C. Azelastine - 1 spray each nostril 1-2 times a day  2.  Continue to Treat reflux / LPR:   A.  pantoprazole 40 mg - 1 tablet in morning  B.  famotidine 40 mg - 1 tablet in the evening  3. Stop doxycycline, Fioricet, oral antihistamines  4. If needed:   A. Nasal saline wash    B. Olive oil to ears a few times per week  C. OTC Ibuprofen  D. Systane eyedrops  5. Further evaluation? Yes, if still with vertigo, or double vision.  6. Obtain COVID vaccine when available  7. Return to clinic in 12 weeks or earlier if problem  It is not entirely clear what Diane's acute onset of double vision and vertigo was caused by but certainly this could have been acute inner ear dysfunction or a transient central neurological event.  She is getting better although she does appear to have  some side effects from the medications that were prescribed including her doxycycline and her oral antihistamines in the form of continued GI upset and worsening dry eye.  She already continues to use a low-dose of doxycycline for her skin and we will have her discontinue her additional doxycycline and of course her oral antihistamines and I have asked her to not use Fioricet on a regular basis.  If she continues to show improvement and completely heals up regarding this issue over the course of the next week or 2 then she will not require any further evaluation.  If she has lingering symptomatology or redevelops acute events then she obviously is going to require an imaging procedure of her brain.  I will see her back in this clinic in 12 weeks or earlier if there is a problem.  Allena Katz, MD Allergy / Immunology [place]

## 2019-05-23 ENCOUNTER — Encounter: Payer: Self-pay | Admitting: Allergy and Immunology

## 2019-05-27 DIAGNOSIS — Z23 Encounter for immunization: Secondary | ICD-10-CM | POA: Diagnosis not present

## 2019-05-28 DIAGNOSIS — M9903 Segmental and somatic dysfunction of lumbar region: Secondary | ICD-10-CM | POA: Diagnosis not present

## 2019-05-28 DIAGNOSIS — M47892 Other spondylosis, cervical region: Secondary | ICD-10-CM | POA: Diagnosis not present

## 2019-05-28 DIAGNOSIS — M9901 Segmental and somatic dysfunction of cervical region: Secondary | ICD-10-CM | POA: Diagnosis not present

## 2019-05-28 DIAGNOSIS — M940 Chondrocostal junction syndrome [Tietze]: Secondary | ICD-10-CM | POA: Diagnosis not present

## 2019-05-28 DIAGNOSIS — H8303 Labyrinthitis, bilateral: Secondary | ICD-10-CM | POA: Diagnosis not present

## 2019-05-28 DIAGNOSIS — R42 Dizziness and giddiness: Secondary | ICD-10-CM | POA: Diagnosis not present

## 2019-05-28 DIAGNOSIS — M9902 Segmental and somatic dysfunction of thoracic region: Secondary | ICD-10-CM | POA: Diagnosis not present

## 2019-05-28 DIAGNOSIS — N644 Mastodynia: Secondary | ICD-10-CM | POA: Diagnosis not present

## 2019-05-28 DIAGNOSIS — M4724 Other spondylosis with radiculopathy, thoracic region: Secondary | ICD-10-CM | POA: Diagnosis not present

## 2019-05-28 DIAGNOSIS — M5416 Radiculopathy, lumbar region: Secondary | ICD-10-CM | POA: Diagnosis not present

## 2019-06-10 DIAGNOSIS — N644 Mastodynia: Secondary | ICD-10-CM | POA: Diagnosis not present

## 2019-06-10 DIAGNOSIS — R928 Other abnormal and inconclusive findings on diagnostic imaging of breast: Secondary | ICD-10-CM | POA: Diagnosis not present

## 2019-06-18 DIAGNOSIS — M4723 Other spondylosis with radiculopathy, cervicothoracic region: Secondary | ICD-10-CM | POA: Diagnosis not present

## 2019-06-18 DIAGNOSIS — M4726 Other spondylosis with radiculopathy, lumbar region: Secondary | ICD-10-CM | POA: Diagnosis not present

## 2019-06-18 DIAGNOSIS — M9905 Segmental and somatic dysfunction of pelvic region: Secondary | ICD-10-CM | POA: Diagnosis not present

## 2019-06-18 DIAGNOSIS — M9902 Segmental and somatic dysfunction of thoracic region: Secondary | ICD-10-CM | POA: Diagnosis not present

## 2019-06-18 DIAGNOSIS — M5432 Sciatica, left side: Secondary | ICD-10-CM | POA: Diagnosis not present

## 2019-06-18 DIAGNOSIS — M9903 Segmental and somatic dysfunction of lumbar region: Secondary | ICD-10-CM | POA: Diagnosis not present

## 2019-06-23 DIAGNOSIS — Z23 Encounter for immunization: Secondary | ICD-10-CM | POA: Diagnosis not present

## 2019-07-15 DIAGNOSIS — J3489 Other specified disorders of nose and nasal sinuses: Secondary | ICD-10-CM | POA: Diagnosis not present

## 2019-07-15 DIAGNOSIS — Z20828 Contact with and (suspected) exposure to other viral communicable diseases: Secondary | ICD-10-CM | POA: Diagnosis not present

## 2019-07-18 DIAGNOSIS — M9902 Segmental and somatic dysfunction of thoracic region: Secondary | ICD-10-CM | POA: Diagnosis not present

## 2019-07-18 DIAGNOSIS — M4723 Other spondylosis with radiculopathy, cervicothoracic region: Secondary | ICD-10-CM | POA: Diagnosis not present

## 2019-07-18 DIAGNOSIS — M5432 Sciatica, left side: Secondary | ICD-10-CM | POA: Diagnosis not present

## 2019-07-18 DIAGNOSIS — M9905 Segmental and somatic dysfunction of pelvic region: Secondary | ICD-10-CM | POA: Diagnosis not present

## 2019-07-18 DIAGNOSIS — M4726 Other spondylosis with radiculopathy, lumbar region: Secondary | ICD-10-CM | POA: Diagnosis not present

## 2019-07-18 DIAGNOSIS — M9903 Segmental and somatic dysfunction of lumbar region: Secondary | ICD-10-CM | POA: Diagnosis not present

## 2019-08-05 ENCOUNTER — Telehealth: Payer: Self-pay | Admitting: *Deleted

## 2019-08-05 NOTE — Telephone Encounter (Signed)
Has she ever had a CT scan or MRI of her head or sinuses?  If not, lets get a sinus CT scan to rule out chronic sinusitis contributing to her drainage.  She just finished a course of antibiotics in February 2021.

## 2019-08-05 NOTE — Telephone Encounter (Signed)
Berda calls stating that her drainage is worse. She states that she is using the nasal sprays as directed and they're not helping. It was said in your last note to discontinue antihistamines. Please advise. She states that the drainage is going down the back of her throat.

## 2019-08-06 NOTE — Telephone Encounter (Signed)
Left a message for Sheryl Ball to call back .

## 2019-08-08 NOTE — Telephone Encounter (Signed)
Lets get her to see [facility] for LPR / laryngeal dysfunction as soon as possible

## 2019-08-08 NOTE — Telephone Encounter (Signed)
Last Head CT was 10/24/2017. Copy of report is on your desk.

## 2019-08-08 NOTE — Telephone Encounter (Signed)
Sheryl Ball states she had an MRI of her head and she thinks she had a CT scan but she couldn't say for sure.  She states she doesn't remember how long ago either.  Sheryl Ball did state that Dr. Rica Records would have that information.  We will contact Dr. Lauralee Evener office to obtain records.

## 2019-08-09 NOTE — Telephone Encounter (Signed)
Called [facility] and patient is scheduled to see Dr. Joya Gaskins on Wednesday, May 5th at 1:20pm.  Informed patient of appointment and gave her the address and telephone number of the center.  Will fax office notes to Dr.Wright.

## 2019-08-13 DIAGNOSIS — M9903 Segmental and somatic dysfunction of lumbar region: Secondary | ICD-10-CM | POA: Diagnosis not present

## 2019-08-13 DIAGNOSIS — M9901 Segmental and somatic dysfunction of cervical region: Secondary | ICD-10-CM | POA: Diagnosis not present

## 2019-08-13 DIAGNOSIS — M4304 Spondylolysis, thoracic region: Secondary | ICD-10-CM | POA: Diagnosis not present

## 2019-08-13 DIAGNOSIS — M4722 Other spondylosis with radiculopathy, cervical region: Secondary | ICD-10-CM | POA: Diagnosis not present

## 2019-08-13 DIAGNOSIS — M9902 Segmental and somatic dysfunction of thoracic region: Secondary | ICD-10-CM | POA: Diagnosis not present

## 2019-08-13 DIAGNOSIS — M4307 Spondylolysis, lumbosacral region: Secondary | ICD-10-CM | POA: Diagnosis not present

## 2019-08-14 ENCOUNTER — Other Ambulatory Visit: Payer: Self-pay

## 2019-08-14 ENCOUNTER — Encounter: Payer: Self-pay | Admitting: Allergy and Immunology

## 2019-08-14 ENCOUNTER — Ambulatory Visit (INDEPENDENT_AMBULATORY_CARE_PROVIDER_SITE_OTHER): Payer: Medicare Other | Admitting: Allergy and Immunology

## 2019-08-14 VITALS — BP 108/70 | HR 54 | Temp 97.6°F | Resp 14

## 2019-08-14 DIAGNOSIS — H81313 Aural vertigo, bilateral: Secondary | ICD-10-CM

## 2019-08-14 DIAGNOSIS — J3089 Other allergic rhinitis: Secondary | ICD-10-CM

## 2019-08-14 DIAGNOSIS — H6983 Other specified disorders of Eustachian tube, bilateral: Secondary | ICD-10-CM | POA: Diagnosis not present

## 2019-08-14 DIAGNOSIS — K219 Gastro-esophageal reflux disease without esophagitis: Secondary | ICD-10-CM

## 2019-08-14 DIAGNOSIS — M3500 Sicca syndrome, unspecified: Secondary | ICD-10-CM | POA: Diagnosis not present

## 2019-08-14 NOTE — Progress Notes (Signed)
Sheryl Ball   Follow-up Note  Referring Provider: Nicholos Johns, MD Primary Provider: Nicholos Johns, MD Date of Office Visit: 08/14/2019  Subjective:   Sheryl Ball (DOB: 15-Nov-1945) is a 74 y.o. female who returns to the [facility] on 08/14/2019 in re-evaluation of the following:  HPI: Sheryl Ball returns to this clinic in reevaluation of rhinitis and LPR and a history of sicca syndrome.  Her last visit to this clinic was 22 May 2019 at which point in time she had relatively acute onset of vertigo.  Her vertigo has not improved.  Although it is somewhat intermittent at this point in time she definitely has vertigo to the point where it is difficult for her to ambulate on occasion.  She does not appear to have any other neurological symptoms but she thinks her hearing may be diminished somewhat.  She does not have tinnitus.  She has also had a lot of drainage in her throat.  She continues to treat her LPR aggressively and has no classic reflux.  She is now using a combination of a Afrin single spray alternate nostril every night along with a nasal steroid and a nasal antihistamine which is working quite well for her airway.  Allergies as of 08/14/2019      Reactions   Morphine Sulfate Nausea And Vomiting   Prednisone       Medication List      alendronate 70 MG tablet Commonly known as: FOSAMAX   Aleve 220 MG Caps Generic drug: Naproxen Sodium Take by mouth as needed.   aspirin EC 81 MG tablet Take 81 mg by mouth daily.   azelastine 0.1 % nasal spray Commonly known as: ASTELIN Use one spray in each nostril every evening as directed.   baclofen 10 MG tablet Commonly known as: LIORESAL TAKE 1 TABLET BY MOUTH TWICE A DAY AS NEEDED INSTEAD OF TIZANIDINE   Biotin 10000 MCG Tabs Take by mouth daily.   busPIRone 10 MG tablet Commonly known as: BUSPAR Take 10 mg by mouth daily.   CoQ-10 200 MG Caps Take 1 tablet  by mouth daily.   donepezil 10 MG tablet Commonly known as: ARICEPT Take 10 mg by mouth daily.   doxycycline 20 MG tablet Commonly known as: PERIOSTAT Take 20 mg by mouth 2 (two) times daily with a meal.   DULoxetine 60 MG capsule Commonly known as: CYMBALTA Take 2 capsules by mouth daily.   famotidine 40 MG tablet Commonly known as: PEPCID Take 1 tablet (40 mg total) by mouth at bedtime.   gabapentin 300 MG capsule Commonly known as: NEURONTIN Take 300 mg by mouth 2 (two) times daily.   meclizine 12.5 MG tablet Commonly known as: ANTIVERT Take 1 tablet by mouth daily.   memantine 10 MG tablet Commonly known as: NAMENDA Take 10 mg by mouth daily.   Nasacort Allergy 24HR 55 MCG/ACT Aero nasal inhaler Generic drug: triamcinolone Place 1 spray into the nose daily.   oxybutynin 10 MG 24 hr tablet Commonly known as: DITROPAN-XL Take 10 mg by mouth daily.   pantoprazole 40 MG tablet Commonly known as: PROTONIX Take 1 tablet (40 mg total) by mouth daily.   rosuvastatin 40 MG tablet Commonly known as: CRESTOR TAKE 1 TABLET BY MOUTH EVERYDAY AT BEDTIME   traZODone 50 MG tablet Commonly known as: DESYREL Take 125 mg by mouth daily. Take 2.5 tablets everyday   TYLENOL PO Take by mouth as  needed.   vitamin B-12 1000 MCG tablet Commonly known as: CYANOCOBALAMIN Take by mouth.   Vitamin D (Ergocalciferol) 1.25 MG (50000 UNIT) Caps capsule Commonly known as: DRISDOL TAKE 1 CAPSULE ONE TIME PER WEEK   VITAMIN D3 PO Take by mouth.       Past Medical History:  Diagnosis Date  . Anxiety   . Arthritis   . Depression   . GERD (gastroesophageal reflux disease)   . Hypercholesteremia   . IBS (irritable bowel syndrome)   . Mitral valve prolapse   . Osteoarthritis   . Panic disorder     Past Surgical History:  Procedure Laterality Date  . ABDOMINAL HYSTERECTOMY     30 years ago  . BACK SURGERY     C5-C7 fusion  . BLADDER SURGERY     bladder lifted twice    . COLONOSCOPY  11/09/2012   Moderate internal hemorrhoids. Mild sigmoid diverticulosis. THe colon was highly redundant.   . COLONOSCOPY WITH ESOPHAGOGASTRODUODENOSCOPY (EGD)    . ESOPHAGOGASTRODUODENOSCOPY  03/10/2010   Schatzkis ring status post dilitation. Small hiatal hernia.   Marland Kitchen HERNIA REPAIR  1998  . TONSILLECTOMY      Review of systems negative except as noted in HPI / PMHx or noted below:  Review of Systems  Constitutional: Negative.   HENT: Negative.   Eyes: Negative.   Respiratory: Negative.   Cardiovascular: Negative.   Gastrointestinal: Negative.   Genitourinary: Negative.   Musculoskeletal: Negative.   Skin: Negative.   Neurological: Negative.   Endo/Heme/Allergies: Negative.   Psychiatric/Behavioral: Negative.      Objective:   Vitals:   08/14/19 1347  BP: 108/70  Pulse: (!) 54  Resp: 14  Temp: 97.6 F (36.4 C)  SpO2: 97%          Physical Exam Constitutional:      Appearance: She is not diaphoretic.  HENT:     Head: Normocephalic.     Right Ear: Ear canal and external ear normal. A middle ear effusion (Dull light reflex) is present.     Left Ear: Ear canal and external ear normal. A middle ear effusion (Dull light reflex) is present.     Nose: Nose normal. No mucosal edema or rhinorrhea.     Mouth/Throat:     Pharynx: Uvula midline. No oropharyngeal exudate.  Eyes:     Conjunctiva/sclera: Conjunctivae normal.  Neck:     Thyroid: No thyromegaly.     Trachea: Trachea normal. No tracheal tenderness or tracheal deviation.  Cardiovascular:     Rate and Rhythm: Normal rate and regular rhythm.     Heart sounds: Normal heart sounds, S1 normal and S2 normal. No murmur.  Pulmonary:     Effort: No respiratory distress.     Breath sounds: Normal breath sounds. No stridor. No wheezing or rales.  Lymphadenopathy:     Head:     Right side of head: No tonsillar adenopathy.     Left side of head: No tonsillar adenopathy.     Cervical: No cervical  adenopathy.  Skin:    Findings: No erythema or rash.     Nails: There is no clubbing.  Neurological:     Mental Status: She is alert.     Diagnostics: none  Assessment and Plan:   1. Perennial allergic rhinitis   2. Sicca syndrome ([place])   3. LPRD (laryngopharyngeal reflux disease)   4. Dysfunction of both eustachian tubes   5. Otogenic vertigo, bilateral  1.  Continue to use the following medications sprayed in nose:   A. OTC Nasacort OR Flonase - 1 spray each nostril 2 times a day  B. Azelastine - 1 spray each nostril 2 times a day  C. Afrin - one spray single nostril every night. Alternate nostrils daily  2.  Continue to Treat reflux / LPR:   A.  pantoprazole 40 mg - 1 tablet in morning  B.  famotidine 40 mg - 1 tablet in the evening  3. If needed:   A. Nasal saline wash    B. Systane eyedrops  5. Visit with [facility] ENT about throat and ear issue  6. Return to clinic in 12 weeks or earlier if problem  Hellena will visit with [facility] ENT department to further explore the issue with her chronic drainage in her throat and her vertigo.  It should be noted that her vertigo started soon after what appeared to be a central neurologic transient event but she also appears to have some degree of ETD and we need to make sure that the issue tied up with her vertigo and some of her hearing loss is an otic issue rather than a central neurologic issue.  Her upper airway appears to be doing pretty well on her current plan.  Her classic reflux appears to be under pretty good control on her current plan.  I will see her back in this clinic in 12 weeks or earlier if there is a problem.  Allena Katz, MD Allergy / Immunology [place]

## 2019-08-14 NOTE — Patient Instructions (Addendum)
  1.  Continue to use the following medications sprayed in nose:   A. OTC Nasacort OR Flonase - 1 spray each nostril 2 times a day  B. Azelastine - 1 spray each nostril 2 times a day  C. Afrin - one spray single nostril every night. Alternate nostrils daily  2.  Continue to Treat reflux / LPR:   A.  pantoprazole 40 mg - 1 tablet in morning  B.  famotidine 40 mg - 1 tablet in the evening  3. If needed:   A. Nasal saline wash    B. Systane eyedrops  5. Visit with [facility] ENT about throat and ear issue  6. Return to clinic in 12 weeks or earlier if problem

## 2019-08-15 ENCOUNTER — Encounter: Payer: Self-pay | Admitting: Allergy and Immunology

## 2019-08-16 ENCOUNTER — Encounter: Payer: Self-pay | Admitting: *Deleted

## 2019-08-21 DIAGNOSIS — J384 Edema of larynx: Secondary | ICD-10-CM | POA: Diagnosis not present

## 2019-08-21 DIAGNOSIS — R130 Aphagia: Secondary | ICD-10-CM | POA: Diagnosis not present

## 2019-08-21 DIAGNOSIS — R42 Dizziness and giddiness: Secondary | ICD-10-CM | POA: Diagnosis not present

## 2019-08-21 DIAGNOSIS — J383 Other diseases of vocal cords: Secondary | ICD-10-CM | POA: Diagnosis not present

## 2019-08-22 DIAGNOSIS — Z139 Encounter for screening, unspecified: Secondary | ICD-10-CM | POA: Diagnosis not present

## 2019-08-22 DIAGNOSIS — M159 Polyosteoarthritis, unspecified: Secondary | ICD-10-CM | POA: Diagnosis not present

## 2019-08-22 DIAGNOSIS — Z6822 Body mass index (BMI) 22.0-22.9, adult: Secondary | ICD-10-CM | POA: Diagnosis not present

## 2019-08-22 DIAGNOSIS — E785 Hyperlipidemia, unspecified: Secondary | ICD-10-CM | POA: Diagnosis not present

## 2019-09-03 DIAGNOSIS — H903 Sensorineural hearing loss, bilateral: Secondary | ICD-10-CM | POA: Diagnosis not present

## 2019-09-10 DIAGNOSIS — M9903 Segmental and somatic dysfunction of lumbar region: Secondary | ICD-10-CM | POA: Diagnosis not present

## 2019-09-10 DIAGNOSIS — M9901 Segmental and somatic dysfunction of cervical region: Secondary | ICD-10-CM | POA: Diagnosis not present

## 2019-09-10 DIAGNOSIS — M4307 Spondylolysis, lumbosacral region: Secondary | ICD-10-CM | POA: Diagnosis not present

## 2019-09-10 DIAGNOSIS — M4722 Other spondylosis with radiculopathy, cervical region: Secondary | ICD-10-CM | POA: Diagnosis not present

## 2019-09-10 DIAGNOSIS — M4304 Spondylolysis, thoracic region: Secondary | ICD-10-CM | POA: Diagnosis not present

## 2019-09-10 DIAGNOSIS — M9902 Segmental and somatic dysfunction of thoracic region: Secondary | ICD-10-CM | POA: Diagnosis not present

## 2019-09-17 ENCOUNTER — Telehealth: Payer: Self-pay | Admitting: *Deleted

## 2019-09-17 NOTE — Telephone Encounter (Signed)
Please refer her to neuro ophthalmology is recommended by ENT.

## 2019-09-17 NOTE — Telephone Encounter (Signed)
Per Dr. Marta Antu: "The patient would benefit from consultation with Dr. Sanda Klein, neuro-ophthalmologist. She will follow-up with her PCP, Dr. Neldon Mc to request referral to neuro-ophthalmology."  Would you like for [place] to initiate the referral?

## 2019-09-17 NOTE — Telephone Encounter (Signed)
Will work on referral tomorrow.

## 2019-09-18 ENCOUNTER — Other Ambulatory Visit: Payer: Self-pay | Admitting: *Deleted

## 2019-09-18 DIAGNOSIS — H532 Diplopia: Secondary | ICD-10-CM

## 2019-09-18 DIAGNOSIS — R42 Dizziness and giddiness: Secondary | ICD-10-CM

## 2019-09-18 NOTE — Telephone Encounter (Signed)
The soonest they could get her in was 9/22 at 9:00. They did put her on the waiting list so hopefully she can get in sooner. I have informed her of this appt and faxed referral/notes to Dr. Earlie Server office.

## 2019-09-29 DIAGNOSIS — D72829 Elevated white blood cell count, unspecified: Secondary | ICD-10-CM | POA: Diagnosis not present

## 2019-09-29 DIAGNOSIS — K5730 Diverticulosis of large intestine without perforation or abscess without bleeding: Secondary | ICD-10-CM | POA: Diagnosis not present

## 2019-09-29 DIAGNOSIS — A419 Sepsis, unspecified organism: Secondary | ICD-10-CM | POA: Diagnosis not present

## 2019-09-29 DIAGNOSIS — R519 Headache, unspecified: Secondary | ICD-10-CM | POA: Diagnosis not present

## 2019-09-29 DIAGNOSIS — R918 Other nonspecific abnormal finding of lung field: Secondary | ICD-10-CM | POA: Diagnosis not present

## 2019-09-29 DIAGNOSIS — R42 Dizziness and giddiness: Secondary | ICD-10-CM | POA: Diagnosis not present

## 2019-09-30 DIAGNOSIS — A419 Sepsis, unspecified organism: Secondary | ICD-10-CM | POA: Diagnosis not present

## 2019-10-11 DIAGNOSIS — J159 Unspecified bacterial pneumonia: Secondary | ICD-10-CM | POA: Diagnosis not present

## 2019-10-11 DIAGNOSIS — R1310 Dysphagia, unspecified: Secondary | ICD-10-CM | POA: Diagnosis not present

## 2019-10-11 DIAGNOSIS — Z7982 Long term (current) use of aspirin: Secondary | ICD-10-CM | POA: Diagnosis not present

## 2019-10-11 DIAGNOSIS — E559 Vitamin D deficiency, unspecified: Secondary | ICD-10-CM | POA: Diagnosis not present

## 2019-10-11 DIAGNOSIS — F418 Other specified anxiety disorders: Secondary | ICD-10-CM | POA: Diagnosis not present

## 2019-10-11 DIAGNOSIS — K589 Irritable bowel syndrome without diarrhea: Secondary | ICD-10-CM | POA: Diagnosis not present

## 2019-10-11 DIAGNOSIS — F5104 Psychophysiologic insomnia: Secondary | ICD-10-CM | POA: Diagnosis not present

## 2019-10-11 DIAGNOSIS — K760 Fatty (change of) liver, not elsewhere classified: Secondary | ICD-10-CM | POA: Diagnosis not present

## 2019-10-11 DIAGNOSIS — K219 Gastro-esophageal reflux disease without esophagitis: Secondary | ICD-10-CM | POA: Diagnosis not present

## 2019-10-11 DIAGNOSIS — M810 Age-related osteoporosis without current pathological fracture: Secondary | ICD-10-CM | POA: Diagnosis not present

## 2019-10-11 DIAGNOSIS — M545 Low back pain: Secondary | ICD-10-CM | POA: Diagnosis not present

## 2019-10-11 DIAGNOSIS — E785 Hyperlipidemia, unspecified: Secondary | ICD-10-CM | POA: Diagnosis not present

## 2019-10-11 DIAGNOSIS — N3281 Overactive bladder: Secondary | ICD-10-CM | POA: Diagnosis not present

## 2019-10-14 ENCOUNTER — Telehealth: Payer: Self-pay

## 2019-10-14 DIAGNOSIS — E785 Hyperlipidemia, unspecified: Secondary | ICD-10-CM | POA: Diagnosis not present

## 2019-10-14 DIAGNOSIS — B999 Unspecified infectious disease: Secondary | ICD-10-CM

## 2019-10-14 DIAGNOSIS — F418 Other specified anxiety disorders: Secondary | ICD-10-CM | POA: Diagnosis not present

## 2019-10-14 DIAGNOSIS — E559 Vitamin D deficiency, unspecified: Secondary | ICD-10-CM | POA: Diagnosis not present

## 2019-10-14 DIAGNOSIS — F5104 Psychophysiologic insomnia: Secondary | ICD-10-CM | POA: Diagnosis not present

## 2019-10-14 DIAGNOSIS — J159 Unspecified bacterial pneumonia: Secondary | ICD-10-CM | POA: Diagnosis not present

## 2019-10-14 DIAGNOSIS — K760 Fatty (change of) liver, not elsewhere classified: Secondary | ICD-10-CM | POA: Diagnosis not present

## 2019-10-14 NOTE — Telephone Encounter (Signed)
Patient called and wanted to inform Dr. Neldon Mc that she had been in the hospital with pneumonia. She states that the pneumonia came from her post nasal drainage, that caused her to aspirate into her lungs. She said if you had any more questions you could give her PCP a call.  Thank You!

## 2019-10-16 DIAGNOSIS — E559 Vitamin D deficiency, unspecified: Secondary | ICD-10-CM | POA: Diagnosis not present

## 2019-10-16 DIAGNOSIS — F5104 Psychophysiologic insomnia: Secondary | ICD-10-CM | POA: Diagnosis not present

## 2019-10-16 DIAGNOSIS — K760 Fatty (change of) liver, not elsewhere classified: Secondary | ICD-10-CM | POA: Diagnosis not present

## 2019-10-16 DIAGNOSIS — J159 Unspecified bacterial pneumonia: Secondary | ICD-10-CM | POA: Diagnosis not present

## 2019-10-16 DIAGNOSIS — F418 Other specified anxiety disorders: Secondary | ICD-10-CM | POA: Diagnosis not present

## 2019-10-16 DIAGNOSIS — E785 Hyperlipidemia, unspecified: Secondary | ICD-10-CM | POA: Diagnosis not present

## 2019-10-17 NOTE — Telephone Encounter (Signed)
Please have patient obtain the following blood tests and investigation of her pneumonia: CBC w/D, IgA/G/M, antipneumo 23 ab, anti-tetanus ab

## 2019-10-17 NOTE — Addendum Note (Signed)
Addended by: Zandra Abts on: 10/17/2019 04:55 PM   Modules accepted: Orders

## 2019-10-17 NOTE — Telephone Encounter (Signed)
Patient informed.  She will get blood tests done at [facility] soon.  Epic order has been placed.

## 2019-10-22 DIAGNOSIS — K760 Fatty (change of) liver, not elsewhere classified: Secondary | ICD-10-CM | POA: Diagnosis not present

## 2019-10-22 DIAGNOSIS — J159 Unspecified bacterial pneumonia: Secondary | ICD-10-CM | POA: Diagnosis not present

## 2019-10-22 DIAGNOSIS — E559 Vitamin D deficiency, unspecified: Secondary | ICD-10-CM | POA: Diagnosis not present

## 2019-10-22 DIAGNOSIS — F418 Other specified anxiety disorders: Secondary | ICD-10-CM | POA: Diagnosis not present

## 2019-10-22 DIAGNOSIS — E785 Hyperlipidemia, unspecified: Secondary | ICD-10-CM | POA: Diagnosis not present

## 2019-10-22 DIAGNOSIS — F5104 Psychophysiologic insomnia: Secondary | ICD-10-CM | POA: Diagnosis not present

## 2019-10-24 DIAGNOSIS — F418 Other specified anxiety disorders: Secondary | ICD-10-CM | POA: Diagnosis not present

## 2019-10-24 DIAGNOSIS — K760 Fatty (change of) liver, not elsewhere classified: Secondary | ICD-10-CM | POA: Diagnosis not present

## 2019-10-24 DIAGNOSIS — B999 Unspecified infectious disease: Secondary | ICD-10-CM | POA: Diagnosis not present

## 2019-10-24 DIAGNOSIS — E785 Hyperlipidemia, unspecified: Secondary | ICD-10-CM | POA: Diagnosis not present

## 2019-10-24 DIAGNOSIS — E559 Vitamin D deficiency, unspecified: Secondary | ICD-10-CM | POA: Diagnosis not present

## 2019-10-24 DIAGNOSIS — J159 Unspecified bacterial pneumonia: Secondary | ICD-10-CM | POA: Diagnosis not present

## 2019-10-24 DIAGNOSIS — F5104 Psychophysiologic insomnia: Secondary | ICD-10-CM | POA: Diagnosis not present

## 2019-10-28 DIAGNOSIS — E785 Hyperlipidemia, unspecified: Secondary | ICD-10-CM | POA: Diagnosis not present

## 2019-10-28 DIAGNOSIS — J159 Unspecified bacterial pneumonia: Secondary | ICD-10-CM | POA: Diagnosis not present

## 2019-10-28 DIAGNOSIS — F5104 Psychophysiologic insomnia: Secondary | ICD-10-CM | POA: Diagnosis not present

## 2019-10-28 DIAGNOSIS — K760 Fatty (change of) liver, not elsewhere classified: Secondary | ICD-10-CM | POA: Diagnosis not present

## 2019-10-28 DIAGNOSIS — F418 Other specified anxiety disorders: Secondary | ICD-10-CM | POA: Diagnosis not present

## 2019-10-28 DIAGNOSIS — E559 Vitamin D deficiency, unspecified: Secondary | ICD-10-CM | POA: Diagnosis not present

## 2019-10-31 DIAGNOSIS — M4724 Other spondylosis with radiculopathy, thoracic region: Secondary | ICD-10-CM | POA: Diagnosis not present

## 2019-10-31 DIAGNOSIS — M47812 Spondylosis without myelopathy or radiculopathy, cervical region: Secondary | ICD-10-CM | POA: Diagnosis not present

## 2019-10-31 DIAGNOSIS — M9903 Segmental and somatic dysfunction of lumbar region: Secondary | ICD-10-CM | POA: Diagnosis not present

## 2019-10-31 DIAGNOSIS — M9901 Segmental and somatic dysfunction of cervical region: Secondary | ICD-10-CM | POA: Diagnosis not present

## 2019-10-31 DIAGNOSIS — M9902 Segmental and somatic dysfunction of thoracic region: Secondary | ICD-10-CM | POA: Diagnosis not present

## 2019-10-31 DIAGNOSIS — M4726 Other spondylosis with radiculopathy, lumbar region: Secondary | ICD-10-CM | POA: Diagnosis not present

## 2019-11-02 LAB — STREP PNEUMONIAE 23 SEROTYPES IGG
Pneumo Ab Type 1*: 2.2 ug/mL (ref >1.3)
Pneumo Ab Type 12 (12F)*: 0.3 ug/mL — ABNORMAL LOW (ref >1.3)
Pneumo Ab Type 14*: 17.7 ug/mL (ref >1.3)
Pneumo Ab Type 17 (17F)*: 9.8 ug/mL (ref >1.3)
Pneumo Ab Type 19 (19F)*: 4.7 ug/mL (ref >1.3)
Pneumo Ab Type 2*: 5.3 ug/mL (ref >1.3)
Pneumo Ab Type 20*: 6.3 ug/mL (ref >1.3)
Pneumo Ab Type 22 (22F)*: 1.7 ug/mL (ref >1.3)
Pneumo Ab Type 23 (23F)*: 0.4 ug/mL — ABNORMAL LOW (ref >1.3)
Pneumo Ab Type 26 (6B)*: 37.4 ug/mL (ref >1.3)
Pneumo Ab Type 3*: 8.9 ug/mL (ref >1.3)
Pneumo Ab Type 34 (10A)*: 2.4 ug/mL (ref >1.3)
Pneumo Ab Type 4*: 0.9 ug/mL — ABNORMAL LOW (ref >1.3)
Pneumo Ab Type 43 (11A)*: 11.4 ug/mL (ref >1.3)
Pneumo Ab Type 5*: 8.2 ug/mL (ref >1.3)
Pneumo Ab Type 51 (7F)*: 0.6 ug/mL — ABNORMAL LOW (ref >1.3)
Pneumo Ab Type 54 (15B)*: 0.3 ug/mL — ABNORMAL LOW (ref >1.3)
Pneumo Ab Type 56 (18C)*: 7.8 ug/mL (ref >1.3)
Pneumo Ab Type 57 (19A)*: >33.6 ug/mL (ref >1.3)
Pneumo Ab Type 68 (9V)*: 15.5 ug/mL (ref >1.3)
Pneumo Ab Type 70 (33F)*: 8.7 ug/mL (ref >1.3)
Pneumo Ab Type 8*: 16.8 ug/mL (ref >1.3)
Pneumo Ab Type 9 (9N)*: 1.2 ug/mL — ABNORMAL LOW (ref >1.3)

## 2019-11-02 LAB — CBC WITH DIFFERENTIAL/PLATELET
Basophils Absolute: 0.1 10*3/uL (ref 0.0–0.2)
Basos: 1 %
EOS (ABSOLUTE): 0.2 10*3/uL (ref 0.0–0.4)
Eos: 2 %
Hematocrit: 41.2 % (ref 34.0–46.6)
Hemoglobin: 13.6 g/dL (ref 11.1–15.9)
Immature Grans (Abs): 0 10*3/uL (ref 0.0–0.1)
Immature Granulocytes: 0 %
Lymphocytes Absolute: 2.4 10*3/uL (ref 0.7–3.1)
Lymphs: 28 %
MCH: 30.8 pg (ref 26.6–33.0)
MCHC: 33 g/dL (ref 31.5–35.7)
MCV: 93 fL (ref 79–97)
Monocytes Absolute: 0.7 10*3/uL (ref 0.1–0.9)
Monocytes: 8 %
Neutrophils Absolute: 5.3 10*3/uL (ref 1.4–7.0)
Neutrophils: 61 %
Platelets: 381 10*3/uL (ref 150–450)
RBC: 4.42 x10E6/uL (ref 3.77–5.28)
RDW: 13 % (ref 11.7–15.4)
WBC: 8.6 10*3/uL (ref 3.4–10.8)

## 2019-11-02 LAB — IGG, IGA, IGM
IgA/Immunoglobulin A, Serum: 195 mg/dL (ref 64–422)
IgG (Immunoglobin G), Serum: 815 mg/dL (ref 586–1602)
IgM (Immunoglobulin M), Srm: 147 mg/dL (ref 26–217)

## 2019-11-02 LAB — TETANUS ANTIBODY, IGG: Tetanus Ab, IgG: 0.89 IU/mL (ref <0.10)

## 2019-11-04 ENCOUNTER — Ambulatory Visit: Payer: Medicare Other | Admitting: Allergy and Immunology

## 2019-11-04 DIAGNOSIS — J384 Edema of larynx: Secondary | ICD-10-CM | POA: Diagnosis not present

## 2019-11-04 DIAGNOSIS — R490 Dysphonia: Secondary | ICD-10-CM | POA: Diagnosis not present

## 2019-11-04 DIAGNOSIS — J309 Allergic rhinitis, unspecified: Secondary | ICD-10-CM | POA: Diagnosis not present

## 2019-11-04 DIAGNOSIS — J383 Other diseases of vocal cords: Secondary | ICD-10-CM | POA: Diagnosis not present

## 2019-11-04 DIAGNOSIS — J310 Chronic rhinitis: Secondary | ICD-10-CM | POA: Diagnosis not present

## 2019-11-04 DIAGNOSIS — H93293 Other abnormal auditory perceptions, bilateral: Secondary | ICD-10-CM | POA: Diagnosis not present

## 2019-11-04 DIAGNOSIS — K219 Gastro-esophageal reflux disease without esophagitis: Secondary | ICD-10-CM | POA: Diagnosis not present

## 2019-11-13 DIAGNOSIS — F329 Major depressive disorder, single episode, unspecified: Secondary | ICD-10-CM | POA: Diagnosis not present

## 2019-11-13 DIAGNOSIS — R5383 Other fatigue: Secondary | ICD-10-CM | POA: Diagnosis not present

## 2019-11-13 DIAGNOSIS — R05 Cough: Secondary | ICD-10-CM | POA: Diagnosis not present

## 2019-11-13 DIAGNOSIS — R42 Dizziness and giddiness: Secondary | ICD-10-CM | POA: Diagnosis not present

## 2019-11-13 DIAGNOSIS — F419 Anxiety disorder, unspecified: Secondary | ICD-10-CM | POA: Diagnosis not present

## 2019-11-13 DIAGNOSIS — Z8701 Personal history of pneumonia (recurrent): Secondary | ICD-10-CM | POA: Diagnosis not present

## 2019-11-13 DIAGNOSIS — Z6822 Body mass index (BMI) 22.0-22.9, adult: Secondary | ICD-10-CM | POA: Diagnosis not present

## 2019-11-14 DIAGNOSIS — Z8701 Personal history of pneumonia (recurrent): Secondary | ICD-10-CM | POA: Diagnosis not present

## 2019-11-14 DIAGNOSIS — J189 Pneumonia, unspecified organism: Secondary | ICD-10-CM | POA: Diagnosis not present

## 2019-12-03 ENCOUNTER — Other Ambulatory Visit: Payer: Self-pay | Admitting: Allergy and Immunology

## 2019-12-31 ENCOUNTER — Other Ambulatory Visit: Payer: Self-pay | Admitting: *Deleted

## 2019-12-31 DIAGNOSIS — Z79899 Other long term (current) drug therapy: Secondary | ICD-10-CM | POA: Diagnosis not present

## 2019-12-31 DIAGNOSIS — R739 Hyperglycemia, unspecified: Secondary | ICD-10-CM | POA: Diagnosis not present

## 2019-12-31 DIAGNOSIS — N3281 Overactive bladder: Secondary | ICD-10-CM | POA: Diagnosis not present

## 2019-12-31 DIAGNOSIS — K219 Gastro-esophageal reflux disease without esophagitis: Secondary | ICD-10-CM | POA: Diagnosis not present

## 2019-12-31 DIAGNOSIS — F419 Anxiety disorder, unspecified: Secondary | ICD-10-CM | POA: Diagnosis not present

## 2019-12-31 DIAGNOSIS — J309 Allergic rhinitis, unspecified: Secondary | ICD-10-CM | POA: Diagnosis not present

## 2019-12-31 DIAGNOSIS — M549 Dorsalgia, unspecified: Secondary | ICD-10-CM | POA: Diagnosis not present

## 2019-12-31 DIAGNOSIS — E559 Vitamin D deficiency, unspecified: Secondary | ICD-10-CM | POA: Diagnosis not present

## 2019-12-31 DIAGNOSIS — M159 Polyosteoarthritis, unspecified: Secondary | ICD-10-CM | POA: Diagnosis not present

## 2019-12-31 DIAGNOSIS — E785 Hyperlipidemia, unspecified: Secondary | ICD-10-CM | POA: Diagnosis not present

## 2019-12-31 DIAGNOSIS — G8929 Other chronic pain: Secondary | ICD-10-CM | POA: Diagnosis not present

## 2019-12-31 DIAGNOSIS — R7989 Other specified abnormal findings of blood chemistry: Secondary | ICD-10-CM | POA: Diagnosis not present

## 2019-12-31 MED ORDER — FAMOTIDINE 40 MG PO TABS: ORAL_TABLET | ORAL | 1 refills | Status: AC

## 2019-12-31 MED ORDER — AZELASTINE HCL 0.1 % NA SOLN: NASAL | 1 refills | Status: DC

## 2020-01-08 DIAGNOSIS — R5383 Other fatigue: Secondary | ICD-10-CM | POA: Diagnosis not present

## 2020-01-08 DIAGNOSIS — R410 Disorientation, unspecified: Secondary | ICD-10-CM | POA: Diagnosis not present

## 2020-01-08 DIAGNOSIS — R42 Dizziness and giddiness: Secondary | ICD-10-CM | POA: Diagnosis not present

## 2020-01-08 DIAGNOSIS — R2681 Unsteadiness on feet: Secondary | ICD-10-CM | POA: Diagnosis not present

## 2020-01-08 DIAGNOSIS — H532 Diplopia: Secondary | ICD-10-CM | POA: Diagnosis not present

## 2020-01-09 DIAGNOSIS — Z23 Encounter for immunization: Secondary | ICD-10-CM | POA: Diagnosis not present

## 2020-01-28 DIAGNOSIS — M9902 Segmental and somatic dysfunction of thoracic region: Secondary | ICD-10-CM | POA: Diagnosis not present

## 2020-01-28 DIAGNOSIS — M4302 Spondylolysis, cervical region: Secondary | ICD-10-CM | POA: Diagnosis not present

## 2020-01-28 DIAGNOSIS — M9903 Segmental and somatic dysfunction of lumbar region: Secondary | ICD-10-CM | POA: Diagnosis not present

## 2020-01-28 DIAGNOSIS — M4145 Neuromuscular scoliosis, thoracolumbar region: Secondary | ICD-10-CM | POA: Diagnosis not present

## 2020-01-28 DIAGNOSIS — M4727 Other spondylosis with radiculopathy, lumbosacral region: Secondary | ICD-10-CM | POA: Diagnosis not present

## 2020-01-28 DIAGNOSIS — M9901 Segmental and somatic dysfunction of cervical region: Secondary | ICD-10-CM | POA: Diagnosis not present

## 2020-02-03 ENCOUNTER — Other Ambulatory Visit: Payer: Self-pay | Admitting: Allergy and Immunology

## 2020-02-03 IMAGING — RF DG ESOPHAGUS
9 of 10 series · 16 of 24 positions shown · non-contrast
Comparison: None.

CLINICAL DATA: Gastroesophageal reflux disease. Trouble with solid
food sticking.

EXAM:
ESOPHOGRAM / BARIUM SWALLOW / BARIUM TABLET STUDY
TECHNIQUE: Combined double contrast and single contrast examination performed
using effervescent crystals, thick barium liquid, and thin barium
liquid. The patient was observed with fluoroscopy swallowing a 13 mm
barium sulphate tablet.
FLUOROSCOPY TIME:  Fluoroscopy Time:  1 minutes 18 seconds
Radiation Exposure Index (if provided by the fluoroscopic device):
5.1 mGy
Number of Acquired Spot Images: 0

[Series 1: cp_standard · 0.52mm/px · 2 of 62 frames shown (1 of 9)]
[frame 10/62]
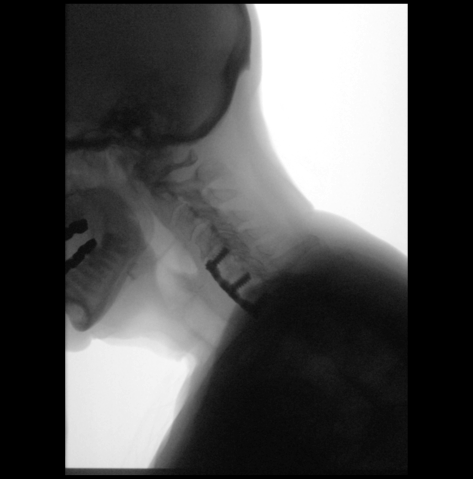
[frame 59/62]
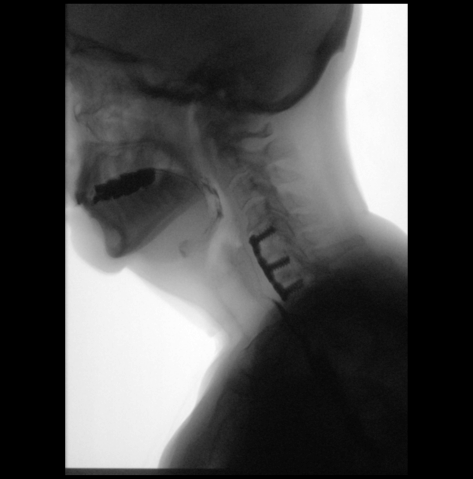

[Series 2: cp_standard · 0.52mm/px · 2 of 23 frames shown (2 of 9)]
[frame 4/23]
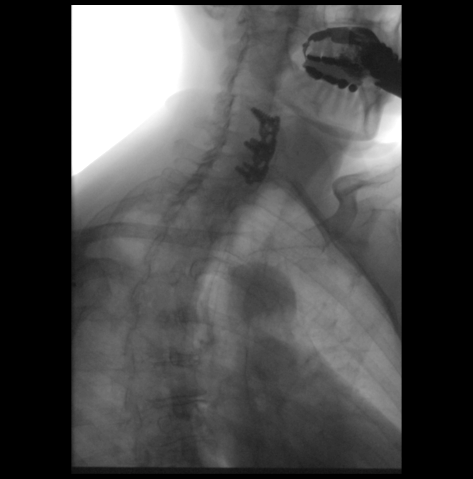
[frame 23/23]
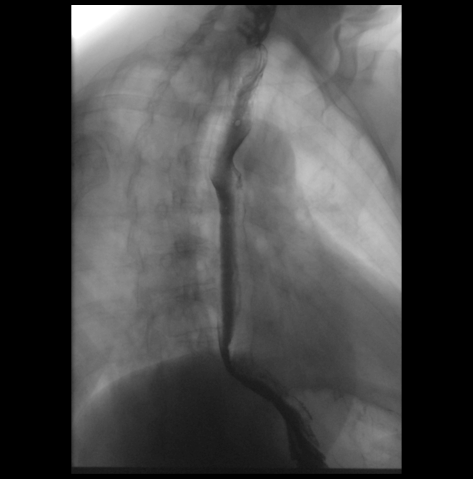

[Series 3: cp_standard · 0.52mm/px · 2 of 23 frames shown (3 of 9)]
[frame 4/23]
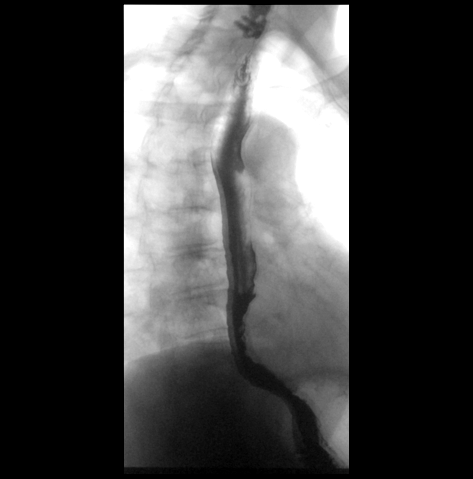
[frame 14/23]
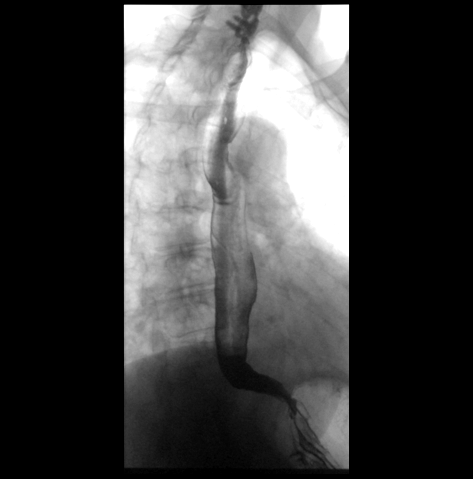

[Series 4: cp_standard · 0.26mm/px · 1 of 1 slices shown (4 of 9)]
[im 1/1]
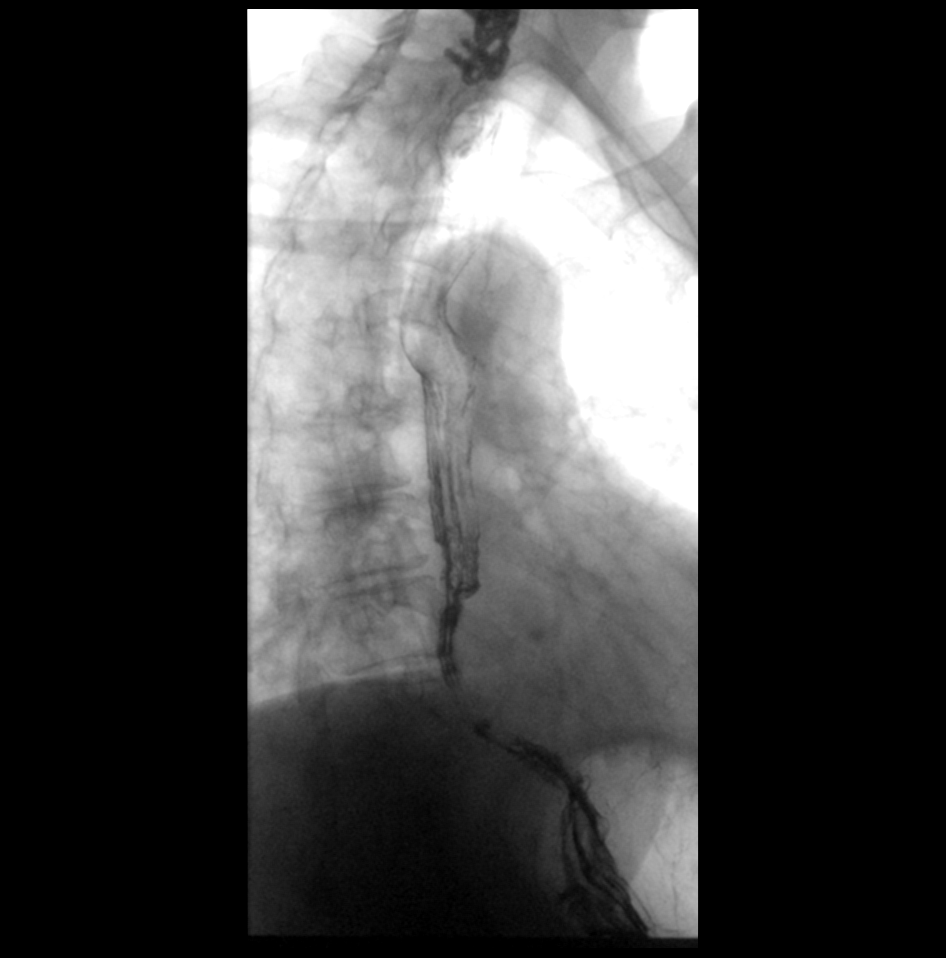

[Series 6: cp_standard · 0.26mm/px · 1 of 1 slices shown (5 of 9)]
[im 1/1]
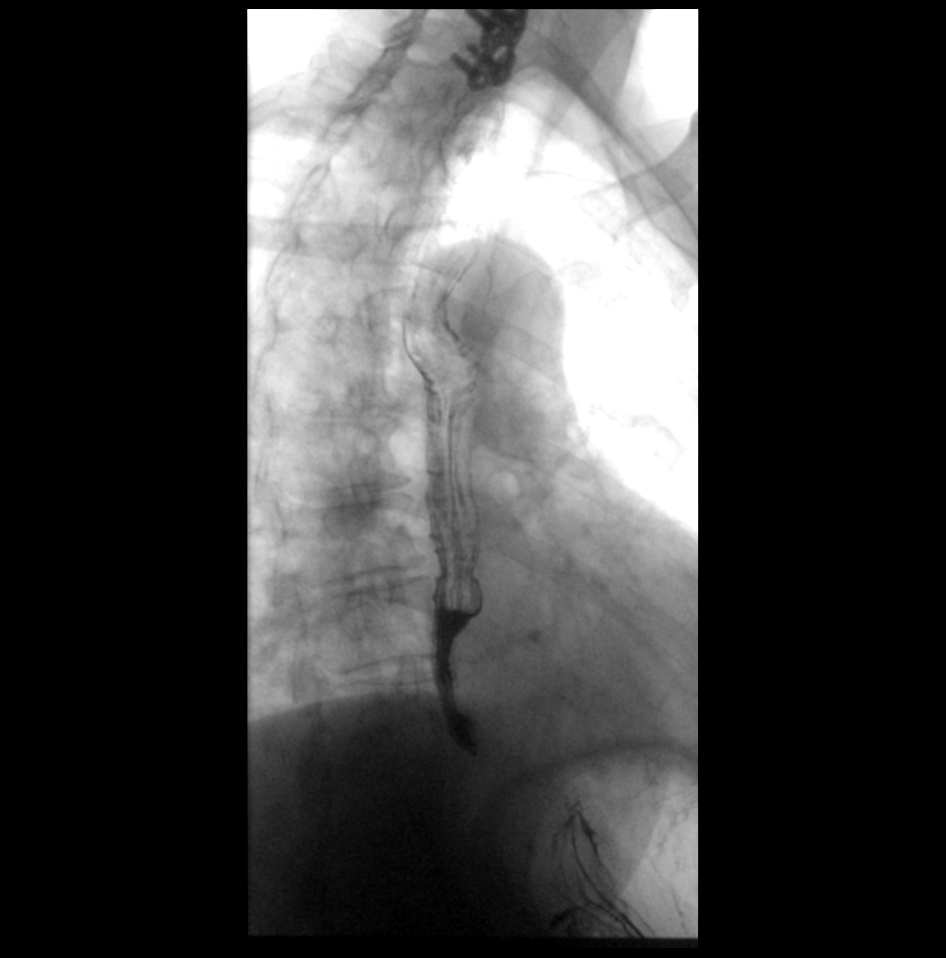

[Series 7: cp_standard · 0.35mm/px · 2 of 15 frames shown (6 of 9)]
[frame 5/15]
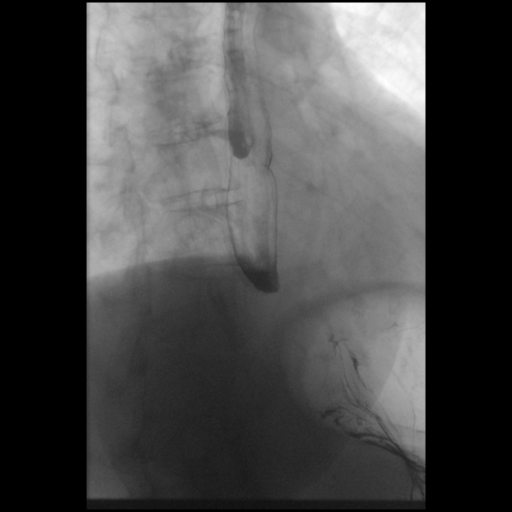
[frame 13/15]
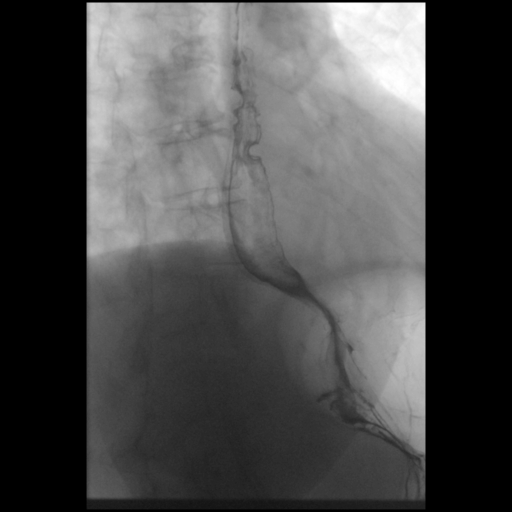

[Series 8: cp_standard · 0.51mm/px · 2 of 31 frames shown (7 of 9)]
[frame 16/31]
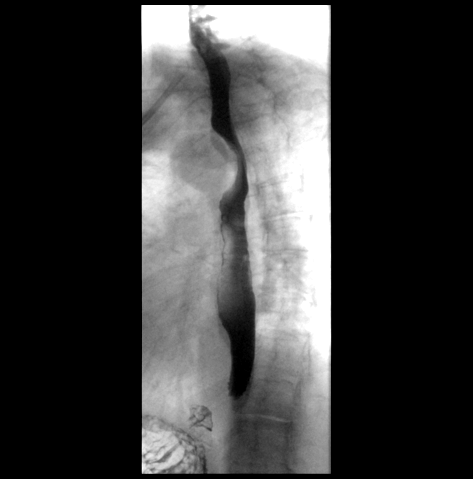
[frame 31/31]
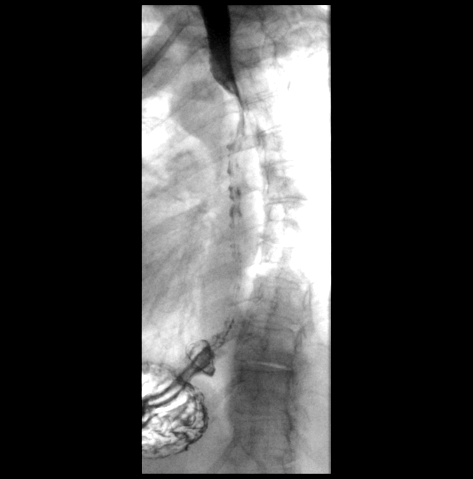

[Series 9: cp_standard · 0.51mm/px · 2 of 32 frames shown (8 of 9)]
[frame 2/32]
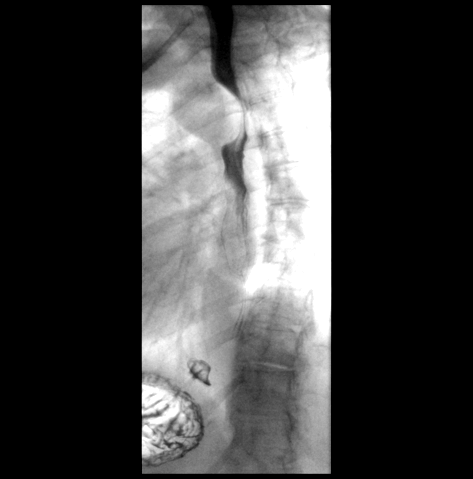
[frame 28/32]
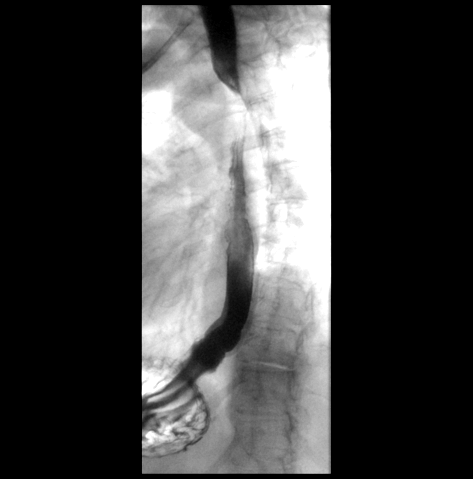

[Series 10: cp_standard · 0.51mm/px · 2 of 12 frames shown (9 of 9)]
[frame 2/12]
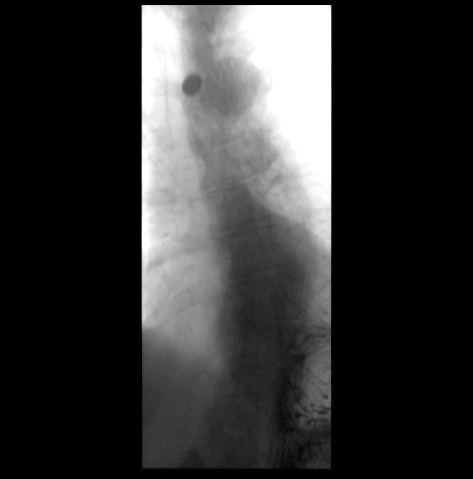
[frame 11/12]
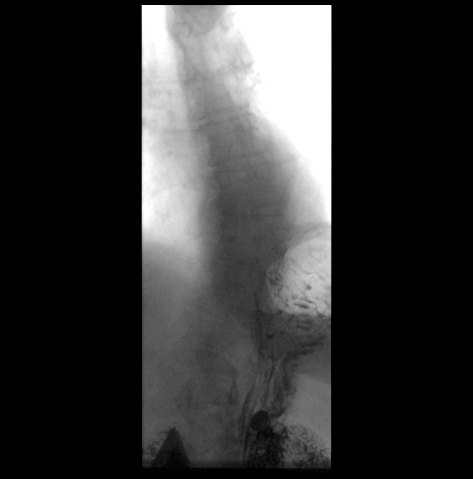

[16 of 24 positions shown; findings below may reference images not displayed]

FINDINGS: Lateral pharyngeal imaging shows good oral coordination and airway
protection. No significant stasis. Redundant appearing mucosa at the
posterior cricoid, normal for this location. There is C5-6 and C6-7
ACDF with solid arthrodesis.

Small hiatal hernia only apparent when recumbent. No detected
mucosal lesion. There is a thin ring at the GE junction that did not
delay passage of a 13 mm barium tablet.

Normal motility.
IMPRESSION: Small hiatal hernia with subtle gastroesophageal junction ring that
did not delay passage of a 13 mm barium tablet.

## 2020-02-25 DIAGNOSIS — M4727 Other spondylosis with radiculopathy, lumbosacral region: Secondary | ICD-10-CM | POA: Diagnosis not present

## 2020-02-25 DIAGNOSIS — M9903 Segmental and somatic dysfunction of lumbar region: Secondary | ICD-10-CM | POA: Diagnosis not present

## 2020-02-25 DIAGNOSIS — M47814 Spondylosis without myelopathy or radiculopathy, thoracic region: Secondary | ICD-10-CM | POA: Diagnosis not present

## 2020-02-25 DIAGNOSIS — M4724 Other spondylosis with radiculopathy, thoracic region: Secondary | ICD-10-CM | POA: Diagnosis not present

## 2020-02-25 DIAGNOSIS — M9901 Segmental and somatic dysfunction of cervical region: Secondary | ICD-10-CM | POA: Diagnosis not present

## 2020-02-25 DIAGNOSIS — M9902 Segmental and somatic dysfunction of thoracic region: Secondary | ICD-10-CM | POA: Diagnosis not present

## 2020-03-20 DIAGNOSIS — M9903 Segmental and somatic dysfunction of lumbar region: Secondary | ICD-10-CM | POA: Diagnosis not present

## 2020-03-20 DIAGNOSIS — M4724 Other spondylosis with radiculopathy, thoracic region: Secondary | ICD-10-CM | POA: Diagnosis not present

## 2020-03-20 DIAGNOSIS — M47814 Spondylosis without myelopathy or radiculopathy, thoracic region: Secondary | ICD-10-CM | POA: Diagnosis not present

## 2020-03-20 DIAGNOSIS — M9902 Segmental and somatic dysfunction of thoracic region: Secondary | ICD-10-CM | POA: Diagnosis not present

## 2020-03-20 DIAGNOSIS — M9901 Segmental and somatic dysfunction of cervical region: Secondary | ICD-10-CM | POA: Diagnosis not present

## 2020-03-20 DIAGNOSIS — M4727 Other spondylosis with radiculopathy, lumbosacral region: Secondary | ICD-10-CM | POA: Diagnosis not present

## 2020-03-23 DIAGNOSIS — M549 Dorsalgia, unspecified: Secondary | ICD-10-CM | POA: Diagnosis not present

## 2020-03-23 DIAGNOSIS — M810 Age-related osteoporosis without current pathological fracture: Secondary | ICD-10-CM | POA: Diagnosis not present

## 2020-03-23 DIAGNOSIS — M159 Polyosteoarthritis, unspecified: Secondary | ICD-10-CM | POA: Diagnosis not present

## 2020-03-23 DIAGNOSIS — E559 Vitamin D deficiency, unspecified: Secondary | ICD-10-CM | POA: Diagnosis not present

## 2020-03-23 DIAGNOSIS — G8929 Other chronic pain: Secondary | ICD-10-CM | POA: Diagnosis not present

## 2020-03-23 DIAGNOSIS — R739 Hyperglycemia, unspecified: Secondary | ICD-10-CM | POA: Diagnosis not present

## 2020-03-23 DIAGNOSIS — N3281 Overactive bladder: Secondary | ICD-10-CM | POA: Diagnosis not present

## 2020-03-23 DIAGNOSIS — E785 Hyperlipidemia, unspecified: Secondary | ICD-10-CM | POA: Diagnosis not present

## 2020-03-23 DIAGNOSIS — R413 Other amnesia: Secondary | ICD-10-CM | POA: Diagnosis not present

## 2020-03-23 DIAGNOSIS — F419 Anxiety disorder, unspecified: Secondary | ICD-10-CM | POA: Diagnosis not present

## 2020-03-23 DIAGNOSIS — K219 Gastro-esophageal reflux disease without esophagitis: Secondary | ICD-10-CM | POA: Diagnosis not present

## 2020-03-23 DIAGNOSIS — J309 Allergic rhinitis, unspecified: Secondary | ICD-10-CM | POA: Diagnosis not present

## 2020-04-02 DIAGNOSIS — M3501 Sicca syndrome with keratoconjunctivitis: Secondary | ICD-10-CM | POA: Diagnosis not present

## 2020-04-09 ENCOUNTER — Other Ambulatory Visit: Payer: Self-pay | Admitting: *Deleted

## 2020-04-09 MED ORDER — AZELASTINE HCL 0.1 % NA SOLN: NASAL | 0 refills | Status: AC

## 2020-04-14 DIAGNOSIS — M436 Torticollis: Secondary | ICD-10-CM | POA: Diagnosis not present

## 2020-04-14 DIAGNOSIS — M159 Polyosteoarthritis, unspecified: Secondary | ICD-10-CM | POA: Diagnosis not present

## 2020-04-14 DIAGNOSIS — J309 Allergic rhinitis, unspecified: Secondary | ICD-10-CM | POA: Diagnosis not present

## 2020-04-14 DIAGNOSIS — N3281 Overactive bladder: Secondary | ICD-10-CM | POA: Diagnosis not present

## 2020-04-14 DIAGNOSIS — M545 Low back pain, unspecified: Secondary | ICD-10-CM | POA: Diagnosis not present

## 2020-04-21 DIAGNOSIS — M4305 Spondylolysis, thoracolumbar region: Secondary | ICD-10-CM | POA: Diagnosis not present

## 2020-04-21 DIAGNOSIS — M4727 Other spondylosis with radiculopathy, lumbosacral region: Secondary | ICD-10-CM | POA: Diagnosis not present

## 2020-04-21 DIAGNOSIS — M9901 Segmental and somatic dysfunction of cervical region: Secondary | ICD-10-CM | POA: Diagnosis not present

## 2020-04-21 DIAGNOSIS — M9902 Segmental and somatic dysfunction of thoracic region: Secondary | ICD-10-CM | POA: Diagnosis not present

## 2020-04-21 DIAGNOSIS — M9903 Segmental and somatic dysfunction of lumbar region: Secondary | ICD-10-CM | POA: Diagnosis not present

## 2020-04-21 DIAGNOSIS — M47892 Other spondylosis, cervical region: Secondary | ICD-10-CM | POA: Diagnosis not present

## 2020-05-10 DIAGNOSIS — R519 Headache, unspecified: Secondary | ICD-10-CM | POA: Diagnosis not present

## 2020-05-10 DIAGNOSIS — R509 Fever, unspecified: Secondary | ICD-10-CM | POA: Diagnosis not present

## 2020-05-10 DIAGNOSIS — J069 Acute upper respiratory infection, unspecified: Secondary | ICD-10-CM | POA: Diagnosis not present

## 2020-05-10 DIAGNOSIS — Z20828 Contact with and (suspected) exposure to other viral communicable diseases: Secondary | ICD-10-CM | POA: Diagnosis not present

## 2020-05-27 DIAGNOSIS — R6889 Other general symptoms and signs: Secondary | ICD-10-CM | POA: Diagnosis not present

## 2020-05-27 DIAGNOSIS — R0982 Postnasal drip: Secondary | ICD-10-CM | POA: Diagnosis not present

## 2020-05-27 DIAGNOSIS — R498 Other voice and resonance disorders: Secondary | ICD-10-CM | POA: Diagnosis not present

## 2020-05-27 DIAGNOSIS — J384 Edema of larynx: Secondary | ICD-10-CM | POA: Diagnosis not present

## 2020-05-27 DIAGNOSIS — J383 Other diseases of vocal cords: Secondary | ICD-10-CM | POA: Diagnosis not present

## 2020-05-27 DIAGNOSIS — R490 Dysphonia: Secondary | ICD-10-CM | POA: Diagnosis not present

## 2020-05-27 DIAGNOSIS — R0981 Nasal congestion: Secondary | ICD-10-CM | POA: Diagnosis not present

## 2020-06-03 DIAGNOSIS — M4305 Spondylolysis, thoracolumbar region: Secondary | ICD-10-CM | POA: Diagnosis not present

## 2020-06-03 DIAGNOSIS — M47892 Other spondylosis, cervical region: Secondary | ICD-10-CM | POA: Diagnosis not present

## 2020-06-03 DIAGNOSIS — M9902 Segmental and somatic dysfunction of thoracic region: Secondary | ICD-10-CM | POA: Diagnosis not present

## 2020-06-03 DIAGNOSIS — M9901 Segmental and somatic dysfunction of cervical region: Secondary | ICD-10-CM | POA: Diagnosis not present

## 2020-06-03 DIAGNOSIS — M4727 Other spondylosis with radiculopathy, lumbosacral region: Secondary | ICD-10-CM | POA: Diagnosis not present

## 2020-06-03 DIAGNOSIS — M9903 Segmental and somatic dysfunction of lumbar region: Secondary | ICD-10-CM | POA: Diagnosis not present

## 2020-06-08 DIAGNOSIS — J0510 Acute epiglottitis without obstruction: Secondary | ICD-10-CM | POA: Diagnosis not present

## 2020-06-08 DIAGNOSIS — J309 Allergic rhinitis, unspecified: Secondary | ICD-10-CM | POA: Diagnosis not present

## 2020-06-08 DIAGNOSIS — J029 Acute pharyngitis, unspecified: Secondary | ICD-10-CM | POA: Diagnosis not present

## 2020-06-08 DIAGNOSIS — Z6822 Body mass index (BMI) 22.0-22.9, adult: Secondary | ICD-10-CM | POA: Diagnosis not present

## 2020-06-09 DIAGNOSIS — Z20828 Contact with and (suspected) exposure to other viral communicable diseases: Secondary | ICD-10-CM | POA: Diagnosis not present

## 2020-06-09 DIAGNOSIS — U071 COVID-19: Secondary | ICD-10-CM | POA: Diagnosis not present

## 2020-06-22 ENCOUNTER — Other Ambulatory Visit: Payer: Self-pay | Admitting: Allergy and Immunology

## 2020-07-02 DIAGNOSIS — J309 Allergic rhinitis, unspecified: Secondary | ICD-10-CM | POA: Diagnosis not present

## 2020-07-02 DIAGNOSIS — R739 Hyperglycemia, unspecified: Secondary | ICD-10-CM | POA: Diagnosis not present

## 2020-07-02 DIAGNOSIS — Z6822 Body mass index (BMI) 22.0-22.9, adult: Secondary | ICD-10-CM | POA: Diagnosis not present

## 2020-07-02 DIAGNOSIS — M159 Polyosteoarthritis, unspecified: Secondary | ICD-10-CM | POA: Diagnosis not present

## 2020-07-02 DIAGNOSIS — Z79899 Other long term (current) drug therapy: Secondary | ICD-10-CM | POA: Diagnosis not present

## 2020-07-02 DIAGNOSIS — G8929 Other chronic pain: Secondary | ICD-10-CM | POA: Diagnosis not present

## 2020-07-02 DIAGNOSIS — M549 Dorsalgia, unspecified: Secondary | ICD-10-CM | POA: Diagnosis not present

## 2020-07-02 DIAGNOSIS — Z1331 Encounter for screening for depression: Secondary | ICD-10-CM | POA: Diagnosis not present

## 2020-07-02 DIAGNOSIS — R7989 Other specified abnormal findings of blood chemistry: Secondary | ICD-10-CM | POA: Diagnosis not present

## 2020-07-02 DIAGNOSIS — K219 Gastro-esophageal reflux disease without esophagitis: Secondary | ICD-10-CM | POA: Diagnosis not present

## 2020-07-02 DIAGNOSIS — E785 Hyperlipidemia, unspecified: Secondary | ICD-10-CM | POA: Diagnosis not present

## 2020-07-02 DIAGNOSIS — E559 Vitamin D deficiency, unspecified: Secondary | ICD-10-CM | POA: Diagnosis not present

## 2020-07-20 DIAGNOSIS — M9902 Segmental and somatic dysfunction of thoracic region: Secondary | ICD-10-CM | POA: Diagnosis not present

## 2020-07-20 DIAGNOSIS — M47894 Other spondylosis, thoracic region: Secondary | ICD-10-CM | POA: Diagnosis not present

## 2020-07-20 DIAGNOSIS — M4727 Other spondylosis with radiculopathy, lumbosacral region: Secondary | ICD-10-CM | POA: Diagnosis not present

## 2020-07-20 DIAGNOSIS — M9901 Segmental and somatic dysfunction of cervical region: Secondary | ICD-10-CM | POA: Diagnosis not present

## 2020-07-20 DIAGNOSIS — M9903 Segmental and somatic dysfunction of lumbar region: Secondary | ICD-10-CM | POA: Diagnosis not present

## 2020-07-20 DIAGNOSIS — M47892 Other spondylosis, cervical region: Secondary | ICD-10-CM | POA: Diagnosis not present

## 2020-08-18 DIAGNOSIS — M9903 Segmental and somatic dysfunction of lumbar region: Secondary | ICD-10-CM | POA: Diagnosis not present

## 2020-08-18 DIAGNOSIS — M47894 Other spondylosis, thoracic region: Secondary | ICD-10-CM | POA: Diagnosis not present

## 2020-08-18 DIAGNOSIS — M9901 Segmental and somatic dysfunction of cervical region: Secondary | ICD-10-CM | POA: Diagnosis not present

## 2020-08-18 DIAGNOSIS — M4727 Other spondylosis with radiculopathy, lumbosacral region: Secondary | ICD-10-CM | POA: Diagnosis not present

## 2020-08-18 DIAGNOSIS — M9902 Segmental and somatic dysfunction of thoracic region: Secondary | ICD-10-CM | POA: Diagnosis not present

## 2020-08-18 DIAGNOSIS — M47892 Other spondylosis, cervical region: Secondary | ICD-10-CM | POA: Diagnosis not present

## 2020-08-23 DIAGNOSIS — M47812 Spondylosis without myelopathy or radiculopathy, cervical region: Secondary | ICD-10-CM | POA: Diagnosis not present

## 2020-08-23 DIAGNOSIS — Z981 Arthrodesis status: Secondary | ICD-10-CM | POA: Diagnosis not present

## 2020-08-23 DIAGNOSIS — R519 Headache, unspecified: Secondary | ICD-10-CM | POA: Diagnosis not present

## 2020-08-23 DIAGNOSIS — M4313 Spondylolisthesis, cervicothoracic region: Secondary | ICD-10-CM | POA: Diagnosis not present

## 2020-08-23 DIAGNOSIS — S59901A Unspecified injury of right elbow, initial encounter: Secondary | ICD-10-CM | POA: Diagnosis not present

## 2020-08-23 DIAGNOSIS — S59911A Unspecified injury of right forearm, initial encounter: Secondary | ICD-10-CM | POA: Diagnosis not present

## 2020-08-23 DIAGNOSIS — S098XXA Other specified injuries of head, initial encounter: Secondary | ICD-10-CM | POA: Diagnosis not present

## 2020-08-23 DIAGNOSIS — Z7982 Long term (current) use of aspirin: Secondary | ICD-10-CM | POA: Diagnosis not present

## 2020-08-23 DIAGNOSIS — S46911A Strain of unspecified muscle, fascia and tendon at shoulder and upper arm level, right arm, initial encounter: Secondary | ICD-10-CM | POA: Diagnosis not present

## 2020-08-23 DIAGNOSIS — S199XXA Unspecified injury of neck, initial encounter: Secondary | ICD-10-CM | POA: Diagnosis not present

## 2020-08-23 DIAGNOSIS — Z79899 Other long term (current) drug therapy: Secondary | ICD-10-CM | POA: Diagnosis not present

## 2020-08-23 DIAGNOSIS — S0003XA Contusion of scalp, initial encounter: Secondary | ICD-10-CM | POA: Diagnosis not present

## 2020-08-23 DIAGNOSIS — S0990XA Unspecified injury of head, initial encounter: Secondary | ICD-10-CM | POA: Diagnosis not present

## 2020-08-23 DIAGNOSIS — S0083XA Contusion of other part of head, initial encounter: Secondary | ICD-10-CM | POA: Diagnosis not present

## 2020-08-23 DIAGNOSIS — S4991XA Unspecified injury of right shoulder and upper arm, initial encounter: Secondary | ICD-10-CM | POA: Diagnosis not present

## 2020-08-23 DIAGNOSIS — M47813 Spondylosis without myelopathy or radiculopathy, cervicothoracic region: Secondary | ICD-10-CM | POA: Diagnosis not present

## 2020-08-24 DIAGNOSIS — Z1231 Encounter for screening mammogram for malignant neoplasm of breast: Secondary | ICD-10-CM | POA: Diagnosis not present

## 2020-10-08 ENCOUNTER — Other Ambulatory Visit: Payer: Self-pay | Admitting: Allergy and Immunology

## 2020-10-13 DIAGNOSIS — R030 Elevated blood-pressure reading, without diagnosis of hypertension: Secondary | ICD-10-CM | POA: Diagnosis not present

## 2020-10-13 DIAGNOSIS — N3946 Mixed incontinence: Secondary | ICD-10-CM | POA: Diagnosis not present

## 2020-10-13 DIAGNOSIS — F411 Generalized anxiety disorder: Secondary | ICD-10-CM | POA: Diagnosis not present

## 2020-10-13 DIAGNOSIS — G8929 Other chronic pain: Secondary | ICD-10-CM | POA: Diagnosis not present

## 2020-10-13 DIAGNOSIS — Z79899 Other long term (current) drug therapy: Secondary | ICD-10-CM | POA: Diagnosis not present

## 2020-10-13 DIAGNOSIS — M545 Low back pain, unspecified: Secondary | ICD-10-CM | POA: Diagnosis not present

## 2020-10-13 DIAGNOSIS — M542 Cervicalgia: Secondary | ICD-10-CM | POA: Diagnosis not present

## 2020-10-13 DIAGNOSIS — R5383 Other fatigue: Secondary | ICD-10-CM | POA: Diagnosis not present

## 2020-10-13 DIAGNOSIS — G3184 Mild cognitive impairment, so stated: Secondary | ICD-10-CM | POA: Diagnosis not present

## 2020-10-13 DIAGNOSIS — R269 Unspecified abnormalities of gait and mobility: Secondary | ICD-10-CM | POA: Diagnosis not present

## 2020-10-15 DIAGNOSIS — E875 Hyperkalemia: Secondary | ICD-10-CM | POA: Diagnosis not present

## 2020-10-21 DIAGNOSIS — M9903 Segmental and somatic dysfunction of lumbar region: Secondary | ICD-10-CM | POA: Diagnosis not present

## 2020-10-21 DIAGNOSIS — M4727 Other spondylosis with radiculopathy, lumbosacral region: Secondary | ICD-10-CM | POA: Diagnosis not present

## 2020-10-21 DIAGNOSIS — M9901 Segmental and somatic dysfunction of cervical region: Secondary | ICD-10-CM | POA: Diagnosis not present

## 2020-10-21 DIAGNOSIS — M4724 Other spondylosis with radiculopathy, thoracic region: Secondary | ICD-10-CM | POA: Diagnosis not present

## 2020-10-21 DIAGNOSIS — M9902 Segmental and somatic dysfunction of thoracic region: Secondary | ICD-10-CM | POA: Diagnosis not present

## 2020-10-21 DIAGNOSIS — M4722 Other spondylosis with radiculopathy, cervical region: Secondary | ICD-10-CM | POA: Diagnosis not present

## 2020-11-11 DIAGNOSIS — Z79899 Other long term (current) drug therapy: Secondary | ICD-10-CM | POA: Diagnosis not present

## 2020-11-11 DIAGNOSIS — M545 Low back pain, unspecified: Secondary | ICD-10-CM | POA: Diagnosis not present

## 2020-11-11 DIAGNOSIS — G8929 Other chronic pain: Secondary | ICD-10-CM | POA: Diagnosis not present

## 2020-11-11 DIAGNOSIS — G3184 Mild cognitive impairment, so stated: Secondary | ICD-10-CM | POA: Diagnosis not present

## 2020-11-11 DIAGNOSIS — M542 Cervicalgia: Secondary | ICD-10-CM | POA: Diagnosis not present

## 2020-11-11 DIAGNOSIS — N1831 Chronic kidney disease, stage 3a: Secondary | ICD-10-CM | POA: Diagnosis not present

## 2020-11-11 DIAGNOSIS — F411 Generalized anxiety disorder: Secondary | ICD-10-CM | POA: Diagnosis not present

## 2020-11-23 DIAGNOSIS — M47812 Spondylosis without myelopathy or radiculopathy, cervical region: Secondary | ICD-10-CM | POA: Diagnosis not present

## 2020-11-23 DIAGNOSIS — G894 Chronic pain syndrome: Secondary | ICD-10-CM | POA: Diagnosis not present

## 2020-11-23 DIAGNOSIS — Z1389 Encounter for screening for other disorder: Secondary | ICD-10-CM | POA: Diagnosis not present

## 2020-11-23 DIAGNOSIS — M4306 Spondylolysis, lumbar region: Secondary | ICD-10-CM | POA: Diagnosis not present

## 2020-11-23 DIAGNOSIS — M549 Dorsalgia, unspecified: Secondary | ICD-10-CM | POA: Diagnosis not present

## 2020-11-26 DIAGNOSIS — M9902 Segmental and somatic dysfunction of thoracic region: Secondary | ICD-10-CM | POA: Diagnosis not present

## 2020-11-26 DIAGNOSIS — M9903 Segmental and somatic dysfunction of lumbar region: Secondary | ICD-10-CM | POA: Diagnosis not present

## 2020-11-26 DIAGNOSIS — M4723 Other spondylosis with radiculopathy, cervicothoracic region: Secondary | ICD-10-CM | POA: Diagnosis not present

## 2020-11-26 DIAGNOSIS — M47894 Other spondylosis, thoracic region: Secondary | ICD-10-CM | POA: Diagnosis not present

## 2020-11-26 DIAGNOSIS — M4726 Other spondylosis with radiculopathy, lumbar region: Secondary | ICD-10-CM | POA: Diagnosis not present

## 2020-11-26 DIAGNOSIS — M9901 Segmental and somatic dysfunction of cervical region: Secondary | ICD-10-CM | POA: Diagnosis not present

## 2020-11-26 DIAGNOSIS — M545 Low back pain, unspecified: Secondary | ICD-10-CM | POA: Diagnosis not present

## 2020-12-15 DIAGNOSIS — M549 Dorsalgia, unspecified: Secondary | ICD-10-CM | POA: Diagnosis not present

## 2020-12-15 DIAGNOSIS — Z1389 Encounter for screening for other disorder: Secondary | ICD-10-CM | POA: Diagnosis not present

## 2020-12-15 DIAGNOSIS — G894 Chronic pain syndrome: Secondary | ICD-10-CM | POA: Diagnosis not present

## 2020-12-15 DIAGNOSIS — M4306 Spondylolysis, lumbar region: Secondary | ICD-10-CM | POA: Diagnosis not present

## 2020-12-15 DIAGNOSIS — M47812 Spondylosis without myelopathy or radiculopathy, cervical region: Secondary | ICD-10-CM | POA: Diagnosis not present

## 2020-12-29 DIAGNOSIS — G894 Chronic pain syndrome: Secondary | ICD-10-CM | POA: Diagnosis not present

## 2020-12-29 DIAGNOSIS — Z1389 Encounter for screening for other disorder: Secondary | ICD-10-CM | POA: Diagnosis not present

## 2020-12-29 DIAGNOSIS — M549 Dorsalgia, unspecified: Secondary | ICD-10-CM | POA: Diagnosis not present

## 2020-12-29 DIAGNOSIS — Z79891 Long term (current) use of opiate analgesic: Secondary | ICD-10-CM | POA: Diagnosis not present

## 2020-12-29 DIAGNOSIS — M47812 Spondylosis without myelopathy or radiculopathy, cervical region: Secondary | ICD-10-CM | POA: Diagnosis not present

## 2020-12-29 DIAGNOSIS — M4306 Spondylolysis, lumbar region: Secondary | ICD-10-CM | POA: Diagnosis not present

## 2021-01-30 DIAGNOSIS — J309 Allergic rhinitis, unspecified: Secondary | ICD-10-CM | POA: Diagnosis not present

## 2021-01-30 DIAGNOSIS — J029 Acute pharyngitis, unspecified: Secondary | ICD-10-CM | POA: Diagnosis not present

## 2021-02-08 DIAGNOSIS — Z23 Encounter for immunization: Secondary | ICD-10-CM | POA: Diagnosis not present

## 2021-02-16 DIAGNOSIS — G3184 Mild cognitive impairment, so stated: Secondary | ICD-10-CM | POA: Diagnosis not present

## 2021-02-16 DIAGNOSIS — M545 Low back pain, unspecified: Secondary | ICD-10-CM | POA: Diagnosis not present

## 2021-02-16 DIAGNOSIS — Z79899 Other long term (current) drug therapy: Secondary | ICD-10-CM | POA: Diagnosis not present

## 2021-02-16 DIAGNOSIS — G8929 Other chronic pain: Secondary | ICD-10-CM | POA: Diagnosis not present

## 2021-02-16 DIAGNOSIS — R0981 Nasal congestion: Secondary | ICD-10-CM | POA: Diagnosis not present

## 2021-02-16 DIAGNOSIS — F411 Generalized anxiety disorder: Secondary | ICD-10-CM | POA: Diagnosis not present

## 2021-02-16 DIAGNOSIS — N1831 Chronic kidney disease, stage 3a: Secondary | ICD-10-CM | POA: Diagnosis not present

## 2021-03-03 DIAGNOSIS — Z79891 Long term (current) use of opiate analgesic: Secondary | ICD-10-CM | POA: Diagnosis not present

## 2021-03-03 DIAGNOSIS — M4306 Spondylolysis, lumbar region: Secondary | ICD-10-CM | POA: Diagnosis not present

## 2021-03-03 DIAGNOSIS — M47812 Spondylosis without myelopathy or radiculopathy, cervical region: Secondary | ICD-10-CM | POA: Diagnosis not present

## 2021-03-03 DIAGNOSIS — Z1389 Encounter for screening for other disorder: Secondary | ICD-10-CM | POA: Diagnosis not present

## 2021-03-03 DIAGNOSIS — M549 Dorsalgia, unspecified: Secondary | ICD-10-CM | POA: Diagnosis not present

## 2021-03-03 DIAGNOSIS — G894 Chronic pain syndrome: Secondary | ICD-10-CM | POA: Diagnosis not present

## 2021-03-16 DIAGNOSIS — Z79899 Other long term (current) drug therapy: Secondary | ICD-10-CM | POA: Diagnosis not present

## 2021-03-16 DIAGNOSIS — F5104 Psychophysiologic insomnia: Secondary | ICD-10-CM | POA: Diagnosis not present

## 2021-03-16 DIAGNOSIS — G3184 Mild cognitive impairment, so stated: Secondary | ICD-10-CM | POA: Diagnosis not present

## 2021-03-16 DIAGNOSIS — F411 Generalized anxiety disorder: Secondary | ICD-10-CM | POA: Diagnosis not present

## 2021-04-01 DIAGNOSIS — Z79891 Long term (current) use of opiate analgesic: Secondary | ICD-10-CM | POA: Diagnosis not present

## 2021-04-01 DIAGNOSIS — G894 Chronic pain syndrome: Secondary | ICD-10-CM | POA: Diagnosis not present

## 2021-04-01 DIAGNOSIS — M4306 Spondylolysis, lumbar region: Secondary | ICD-10-CM | POA: Diagnosis not present

## 2021-04-01 DIAGNOSIS — Z1389 Encounter for screening for other disorder: Secondary | ICD-10-CM | POA: Diagnosis not present

## 2021-04-01 DIAGNOSIS — M549 Dorsalgia, unspecified: Secondary | ICD-10-CM | POA: Diagnosis not present

## 2021-04-01 DIAGNOSIS — M47812 Spondylosis without myelopathy or radiculopathy, cervical region: Secondary | ICD-10-CM | POA: Diagnosis not present

## 2021-04-05 DIAGNOSIS — K219 Gastro-esophageal reflux disease without esophagitis: Secondary | ICD-10-CM | POA: Diagnosis not present

## 2021-04-05 DIAGNOSIS — G3184 Mild cognitive impairment, so stated: Secondary | ICD-10-CM | POA: Diagnosis not present

## 2021-04-05 DIAGNOSIS — Z79899 Other long term (current) drug therapy: Secondary | ICD-10-CM | POA: Diagnosis not present

## 2021-04-05 DIAGNOSIS — M810 Age-related osteoporosis without current pathological fracture: Secondary | ICD-10-CM | POA: Diagnosis not present

## 2021-04-05 DIAGNOSIS — M8589 Other specified disorders of bone density and structure, multiple sites: Secondary | ICD-10-CM | POA: Diagnosis not present

## 2021-04-05 DIAGNOSIS — N1831 Chronic kidney disease, stage 3a: Secondary | ICD-10-CM | POA: Diagnosis not present

## 2021-04-07 DIAGNOSIS — G3184 Mild cognitive impairment, so stated: Secondary | ICD-10-CM | POA: Diagnosis not present

## 2021-04-08 DIAGNOSIS — R413 Other amnesia: Secondary | ICD-10-CM | POA: Diagnosis not present

## 2021-04-08 DIAGNOSIS — Z23 Encounter for immunization: Secondary | ICD-10-CM | POA: Diagnosis not present

## 2021-04-15 DIAGNOSIS — G3184 Mild cognitive impairment, so stated: Secondary | ICD-10-CM | POA: Diagnosis not present

## 2021-04-15 DIAGNOSIS — R413 Other amnesia: Secondary | ICD-10-CM | POA: Diagnosis not present

## 2021-04-16 DIAGNOSIS — H6122 Impacted cerumen, left ear: Secondary | ICD-10-CM | POA: Diagnosis not present

## 2021-04-16 DIAGNOSIS — R0981 Nasal congestion: Secondary | ICD-10-CM | POA: Diagnosis not present

## 2021-04-16 DIAGNOSIS — J343 Hypertrophy of nasal turbinates: Secondary | ICD-10-CM | POA: Diagnosis not present

## 2021-04-16 DIAGNOSIS — Z974 Presence of external hearing-aid: Secondary | ICD-10-CM | POA: Diagnosis not present

## 2021-04-16 DIAGNOSIS — H9193 Unspecified hearing loss, bilateral: Secondary | ICD-10-CM | POA: Diagnosis not present

## 2021-04-16 DIAGNOSIS — S0992XS Unspecified injury of nose, sequela: Secondary | ICD-10-CM | POA: Diagnosis not present

## 2021-04-16 DIAGNOSIS — J342 Deviated nasal septum: Secondary | ICD-10-CM | POA: Diagnosis not present

## 2021-04-16 DIAGNOSIS — J300 Vasomotor rhinitis: Secondary | ICD-10-CM | POA: Diagnosis not present

## 2023-12-19 NOTE — Progress Notes (Addendum)
 12/20/2023 Sheryl Ball [id number] 13-Apr-1946  Referring provider: Gable Cambric, MD Primary GI doctor: Dr. Charlanne  ASSESSMENT AND PLAN:  Rectal bleeding with fecal incontinence/smearing for at least a year Small volume, can be without a BM, has to wear a pad, worse with moving around No rectal pain, itching, burning She does have constipation worsening with hydrocodone use Anoscopy with inflamed right anterior hemorrhoid with polyp 10/2012 colonoscopy mild sigmoid diverticulosis, highly redundant colon, internal hemorrhoids otherwise normal TI Recall 10 years -Will get KUB, consider CT -Sitz baths, increase fiber, increase water -Hydrocortisone  supp given and external cream sent in.  - will schedule for colonoscopy with Dr. Charlanne as patient is healthy enough and last colon was 2014. We have discussed the risks of bleeding, infection, perforation, medication reactions, and remote risk of death associated with colonoscopy. All questions were answered and the patient acknowledges these risk and wishes to proceed. -We discussed hemorrhoid banding here in the office for internal hemorrhoids if not improving with conservative treatment and colonoscopy normal  Constipation Likely due to hydrocodone use and pelvic floor Get Xray, consider CT scan Will do 2 day prep with history of constipation Constipation likely due to hydrocodone use, contributing to hemorrhoid inflammation and bleeding. Discussed alternatives for pain management covered by Medicare. - Initiate Miralax and fiber supplement such as Benefiber or Citrucel. - Discuss with primary care provider about alternatives to hydrocodone. - consider pelvic floor PT - consider linzess if not helping  GERD with history of dysphagia 01/10/2017 barium swallow small HH, subtle gastroesophageal junction ring did not delay passage of 13 mm barium tablet 01/12/2018 EGD Schatzki ring mild stenosis status post dilation, small transient HH, erosive  gastritis.   Negative H. pylori negative EOE. No symptoms at this time Continue PPI  Patient Care Team: Gable Cambric, MD as PCP - General (Internal Medicine)  HISTORY OF PRESENT ILLNESS: 78 y.o. female with a past medical history listed below presents for evaluation of rectal bleeding.   Last seen in the office 12/29/2017 by Dr. Charlanne for esophageal dysphagia. Husband Sheryl Ball is here and provides some of the history.   Discussed the use of AI scribe software for clinical note transcription with the patient, who gave verbal consent to proceed.  History of Present Illness   Sheryl Ball is an 78 year old female with diverticulosis and internal hemorrhoids who presents with rectal bleeding. She is accompanied by her husband, Reg. She has experienced rectal bleeding for approximately one to two years, with more consistent symptoms over the past year. The bleeding is bright red, not a large amount, but she wears a pad daily to avoid staining her underwear. It is more noticeable when she is active, often present on toilet paper and occasionally on a pad, but rarely in the toilet. No associated rectal pain, pressure, itching, or burning.  She has a history of diverticulosis, identified during a colonoscopy in July 2014, and internal hemorrhoids. She is consistently constipated, having bowel movements approximately twice a week. She takes hydrocodone once daily for pain management, contributing to her constipation. She also takes pantoprazole  and Pepcid  for reflux, which is currently controlled. No nausea, abdominal pain, or changes in bowel habits aside from constipation.  Her current medications include hydrocodone, doxycycline, rosuvastatin, pantoprazole , Pepcid , Cymbalta, and bupropion. She receives Reclast twice a year for bone health and has a history of using tramadol, which was switched to hydrocodone. She denies using any pain patches currently.  She reports a family history  of hemorrhoids, as  her mother also had them, but she is unsure if her mother experienced similar bleeding. She has two sons and attributes some of her symptoms to childbirth. She experiences significant gas, which she finds difficult to control, and certain foods may exacerbate this symptom.        She  reports that she has never smoked. She has never used smokeless tobacco. She reports that she does not drink alcohol and does not use drugs.  RELEVANT GI HISTORY, IMAGING AND LABS: Results   DIAGNOSTIC Colonoscopy: Diverticulosis and internal hemorrhoids (10/2012)     Past GI procedures: -EGD with esophageal dilatation 02/2010: Schatzki's ring status post esophageal dilatation 48 F4 [place], small hiatal hernia, negative biopsies for EoE. -Screening colonoscopy 10/2012: Mild sigmoid diverticulosis, highly redundant colon, internal hemorrhoids.  Otherwise normal to TI.  Repeat in 10 years.  Earlier, if any problems CBC    Component Value Date/Time   WBC 8.6 10/24/2019 1146   RBC 4.42 10/24/2019 1146   HGB 13.6 10/24/2019 1146   HCT 41.2 10/24/2019 1146   PLT 381 10/24/2019 1146   MCV 93 10/24/2019 1146   MCH 30.8 10/24/2019 1146   MCHC 33.0 10/24/2019 1146   RDW 13.0 10/24/2019 1146   LYMPHSABS 2.4 10/24/2019 1146   EOSABS 0.2 10/24/2019 1146   BASOSABS 0.1 10/24/2019 1146   No results for input(s): HGB in the last 8760 hours.  CMP  No results found for: NA, K, CL, CO2, GLUCOSE, BUN, CREATININE, CALCIUM, PROT, ALBUMIN, AST, ALT, ALKPHOS, BILITOT, GFRNONAA, GFRAA     No data to display            Current Medications:   Current Outpatient Medications (Endocrine & Metabolic):    zoledronic acid (RECLAST) 5 MG/100ML SOLN injection, Inject 5 mg into the vein once.  Current Outpatient Medications (Cardiovascular):    rosuvastatin (CRESTOR) 40 MG tablet, TAKE 1 TABLET BY MOUTH EVERYDAY AT BEDTIME  Current Outpatient Medications (Respiratory):    azelastine   (ASTELIN ) 0.1 % nasal spray, Use one spray in each nostril twice daily as directed   montelukast (SINGULAIR) 10 MG tablet, Take 10 mg by mouth at bedtime.   triamcinolone  (NASACORT  ALLERGY  24HR) 55 MCG/ACT AERO nasal inhaler, Place 1 spray into the nose daily.  Current Outpatient Medications (Analgesics):    Acetaminophen (TYLENOL PO), Take by mouth as needed.   HYDROcodone-acetaminophen (NORCO/VICODIN) 5-325 MG tablet, Take 1 tablet by mouth 2 (two) times daily as needed.  Current Outpatient Medications (Hematological):    vitamin B-12 (CYANOCOBALAMIN) 1000 MCG tablet, Take by mouth.  Current Outpatient Medications (Other):    Biotin 10000 MCG TABS, Take by mouth daily.   busPIRone (BUSPAR) 10 MG tablet, Take 10 mg by mouth daily.   Cholecalciferol (VITAMIN D3 PO), Take by mouth.   Coenzyme Q10 (COQ-10) 200 MG CAPS, Take 1 tablet by mouth daily.   doxycycline (PERIOSTAT) 20 MG tablet, Take 20 mg by mouth 2 (two) times daily with a meal.   DULoxetine (CYMBALTA) 60 MG capsule, Take 2 capsules by mouth daily.   famotidine  (PEPCID ) 40 MG tablet, Take one tablet once daily as directed   hydrocortisone  (ANUSOL -HC) 2.5 % rectal cream, Place 1 Application rectally 2 (two) times daily.   hydrocortisone  (ANUSOL -HC) 25 MG suppository, Place 1 suppository (25 mg total) rectally 2 (two) times daily.   memantine (NAMENDA) 10 MG tablet, Take 10 mg by mouth daily.   Multiple Vitamin (MULTIVITAMIN) tablet, Take 1 tablet by mouth daily.  oxybutynin (DITROPAN-XL) 10 MG 24 hr tablet, Take 10 mg by mouth daily.   pantoprazole  (PROTONIX ) 40 MG tablet, Take 1 tablet (40 mg total) by mouth daily.   Vitamin D, Ergocalciferol, (DRISDOL) 1.25 MG (50000 UT) CAPS capsule, TAKE 1 CAPSULE ONE TIME PER WEEK   buPROPion (WELLBUTRIN XL) 300 MG 24 hr tablet, Take 300 mg by mouth daily.  Medical History:  Past Medical History:  Diagnosis Date   Anxiety    Arthritis    Depression    GERD (gastroesophageal reflux  disease)    Hypercholesteremia    IBS (irritable bowel syndrome)    Mitral valve prolapse    Osteoarthritis    Panic disorder    Allergies:  Allergies  Allergen Reactions   Morphine Sulfate Nausea And Vomiting   Prednisone      Surgical History:  She  has a past surgical history that includes Colonoscopy (11/09/2012); Colonoscopy with esophagogastroduodenoscopy (egd); Esophagogastroduodenoscopy (03/10/2010); Hernia repair (1998); Abdominal hysterectomy; Back surgery; Bladder surgery; and Tonsillectomy. Family History:  Her family history includes Bone cancer in her father.  REVIEW OF SYSTEMS  : All other systems reviewed and negative except where noted in the History of Present Illness.  PHYSICAL EXAM: BP 124/62 (BP Location: Left Arm, Patient Position: Sitting, Cuff Size: Normal)   Pulse 77   Ht 5' 1 (1.549 m)   Wt 117 lb (53.1 kg)   BMI 22.11 kg/m  Physical Exam   GENERAL APPEARANCE: Well nourished, in no apparent distress. HEENT: No cervical lymphadenopathy, unremarkable thyroid , sclerae anicteric, conjunctiva pink. RESPIRATORY: Respiratory effort normal, BS equal bilateral without rales, rhonchi, wheezing. CARDIO: RRR with no MRGs, peripheral pulses intact. ABDOMEN: Soft, non distended, active bowel sounds in all 4 quadrants, no tenderness to palpation, no rebound, no mass appreciated. RECTAL: Hemorrhoidal skin tag posterior right. No fissures or abnormal findings. No stool palpable in rectum. Right posterior and anterior hemorrhoids enlarged and inflamed. Decreased rectal tone. MUSCULOSKELETAL: Full ROM, normal gait, without edema. SKIN: Dry, intact without rashes or lesions. No jaundice. NEURO: Alert, oriented, no focal deficits. PSYCH: Cooperative, normal mood and affect.     Rectal:  External exam: external rectal skin tags Rectal tone: decreased rectal tone Internal exam: non-tender, no masses, No stool in vault,  hemoccult Negative Anoscopy was performed after  rectal exam with the patient in the left lateral decubitus position and revealed Right anterior hemorrhoids inflamed with white polyp Right posterior hemorrhoids  Alan JONELLE Coombs, PA-C 12:02 PM

## 2023-12-20 ENCOUNTER — Encounter: Payer: Self-pay | Admitting: Physician Assistant

## 2023-12-20 ENCOUNTER — Ambulatory Visit (INDEPENDENT_AMBULATORY_CARE_PROVIDER_SITE_OTHER): Admitting: Physician Assistant

## 2023-12-20 ENCOUNTER — Ambulatory Visit (INDEPENDENT_AMBULATORY_CARE_PROVIDER_SITE_OTHER)
Admission: RE | Admit: 2023-12-20 | Discharge: 2023-12-20 | Disposition: A | Discharge status: Home / Self Care | Source: Ambulatory Visit | Attending: Physician Assistant | Admitting: Physician Assistant

## 2023-12-20 ENCOUNTER — Other Ambulatory Visit (INDEPENDENT_AMBULATORY_CARE_PROVIDER_SITE_OTHER)

## 2023-12-20 VITALS — BP 124/62 | HR 77 | Ht 61.0 in | Wt 117.0 lb

## 2023-12-20 DIAGNOSIS — K5904 Chronic idiopathic constipation: Secondary | ICD-10-CM

## 2023-12-20 DIAGNOSIS — K219 Gastro-esophageal reflux disease without esophagitis: Secondary | ICD-10-CM

## 2023-12-20 DIAGNOSIS — R159 Full incontinence of feces: Secondary | ICD-10-CM

## 2023-12-20 DIAGNOSIS — K625 Hemorrhage of anus and rectum: Secondary | ICD-10-CM | POA: Diagnosis not present

## 2023-12-20 DIAGNOSIS — K649 Unspecified hemorrhoids: Secondary | ICD-10-CM

## 2023-12-20 DIAGNOSIS — K5903 Drug induced constipation: Secondary | ICD-10-CM

## 2023-12-20 DIAGNOSIS — T402X5A Adverse effect of other opioids, initial encounter: Secondary | ICD-10-CM

## 2023-12-20 DIAGNOSIS — K621 Rectal polyp: Secondary | ICD-10-CM

## 2023-12-20 DIAGNOSIS — K6289 Other specified diseases of anus and rectum: Secondary | ICD-10-CM

## 2023-12-20 LAB — COMPREHENSIVE METABOLIC PANEL WITH GFR
ALT: 18 U/L (ref 0–35)
AST: 21 U/L (ref 0–37)
Albumin: 4.2 g/dL (ref 3.5–5.2)
Alkaline Phosphatase: 51 U/L (ref 39–117)
BUN: 20 mg/dL (ref 6–23)
CO2: 30 meq/L (ref 19–32)
Calcium: 9.2 mg/dL (ref 8.4–10.5)
Chloride: 102 meq/L (ref 96–112)
Creatinine, Ser: 1.1 mg/dL (ref 0.40–1.20)
GFR: 47.47 mL/min — ABNORMAL LOW (ref >60.00)
Glucose, Bld: 83 mg/dL (ref 70–99)
Potassium: 3.6 meq/L (ref 3.5–5.1)
Sodium: 140 meq/L (ref 135–145)
Total Bilirubin: 0.4 mg/dL (ref 0.2–1.2)
Total Protein: 7.3 g/dL (ref 6.0–8.3)

## 2023-12-20 LAB — IBC + FERRITIN
Ferritin: 21.4 ng/mL (ref 10.0–291.0)
Iron: 84 ug/dL (ref 42–145)
Saturation Ratios: 22.1 % (ref 20.0–50.0)
TIBC: 379.4 ug/dL (ref 250.0–450.0)
Transferrin: 271 mg/dL (ref 212.0–360.0)

## 2023-12-20 LAB — CBC WITH DIFFERENTIAL/PLATELET
Basophils Absolute: 0.1 K/uL (ref 0.0–0.1)
Basophils Relative: 0.5 % (ref 0.0–3.0)
Eosinophils Absolute: 0.4 K/uL (ref 0.0–0.7)
Eosinophils Relative: 3.6 % (ref 0.0–5.0)
HCT: 37.2 % (ref 36.0–46.0)
Hemoglobin: 12.1 g/dL (ref 12.0–15.0)
Lymphocytes Relative: 14.6 % (ref 12.0–46.0)
Lymphs Abs: 1.8 K/uL (ref 0.7–4.0)
MCHC: 32.5 g/dL (ref 30.0–36.0)
MCV: 95.1 fl (ref 78.0–100.0)
Monocytes Absolute: 1.5 K/uL — ABNORMAL HIGH (ref 0.1–1.0)
Monocytes Relative: 12 % (ref 3.0–12.0)
Neutro Abs: 8.5 K/uL — ABNORMAL HIGH (ref 1.4–7.7)
Neutrophils Relative %: 69.3 % (ref 43.0–77.0)
Platelets: 321 K/uL (ref 150.0–400.0)
RBC: 3.91 Mil/uL (ref 3.87–5.11)
RDW: 13.6 % (ref 11.5–15.5)
WBC: 12.3 K/uL — ABNORMAL HIGH (ref 4.0–10.5)

## 2023-12-20 LAB — SEDIMENTATION RATE: Sed Rate: 42 mm/h — ABNORMAL HIGH (ref 0–30)

## 2023-12-20 MED ORDER — NA SULFATE-K SULFATE-MG SULF 17.5-3.13-1.6 GM/177ML PO SOLN: 1.0000 | Freq: Once | ORAL | 0 refills | Status: AC

## 2023-12-20 MED ORDER — HYDROCORTISONE (PERIANAL) 2.5 % EX CREA: 1.0000 | TOPICAL_CREAM | Freq: Two times a day (BID) | CUTANEOUS | 2 refills | Status: AC

## 2023-12-20 MED ORDER — HYDROCORTISONE ACETATE 25 MG RE SUPP: 25.0000 mg | Freq: Two times a day (BID) | RECTAL | 0 refills | Status: AC

## 2023-12-20 NOTE — Patient Instructions (Addendum)
 Your provider has requested that you go to the basement level for lab work before leaving today. Press B on the elevator. The lab is located at the first door on the left as you exit the elevator.  Your provider has requested that you have an abdominal x ray before leaving today. Please go to the basement floor to our Radiology department for the test.  You have been scheduled for a colonoscopy. Please follow written instructions given to you at your visit today.   If you use inhalers (even only as needed), please bring them with you on the day of your procedure.  DO NOT TAKE 7 DAYS PRIOR TO TEST- Trulicity (dulaglutide) Ozempic, Wegovy (semaglutide) Mounjaro (tirzepatide) Bydureon Bcise (exanatide extended release)  DO NOT TAKE 1 DAY PRIOR TO YOUR TEST Rybelsus (semaglutide) Adlyxin (lixisenatide) Victoza (liraglutide) Byetta (exanatide) ___________________________________________________________________________    Monique is an osmotic laxative.  It only brings more water into the stool.  This is safe to take daily.  Can take up to 17 gram of miralax twice a day.  Mix with juice or coffee.  Start 1 capful at night for 3-4 days and reassess your response in 3-4 days.  You can increase and decrease the dose based on your response.  Remember, it can take up to 3-4 days to take effect OR for the effects to wear off.   I often pair this with benefiber in the morning to help assure the stool is not too loose.   FIBER SUPPLEMENT You can do metamucil or fibercon once or twice a day but if this causes gas/bloating please switch to Benefiber or Citracel.  Fiber is good for constipation/diarrhea/irritable bowel syndrome.  It can also help with weight loss and can help lower your bad cholesterol (LDL).  Please do 1 TBSP in the morning in water, coffee, or tea.  It can take up to a month before you can see a difference with your bowel movements.  It is cheapest from [employer], [employer],  [employer].     Please do sitz baths- these can be found at the pharmacy. It is a Chief Operating Officer that is put in your toliet.  Please increase fiber or add benefiber, increase water and increase acitivity.  Will send in hydrocoritsone suppository, cheapest with GOODRX from [employer], [employer], [employer] or [employer] if your insurance does not pay for it. If the hemorrhoid suppository sent in is too expensive you can do this over the counter trick.  Apply a pea size amount of generic prescription Anusol  HC cream that has been sent into your pharmacy to the tip of an over the counter PrepH suppository and insert rectally once every night for at least 7 nights.  If this does not improve there are procedures that can be done.   About Hemorrhoids  Hemorrhoids are swollen veins in the lower rectum and anus.  Also called piles, hemorrhoids are a common problem.  Hemorrhoids may be internal (inside the rectum) or external (around the anus).  Internal Hemorrhoids  Internal hemorrhoids are often painless, but they rarely cause bleeding.  The internal veins may stretch and fall down (prolapse) through the anus to the outside of the body.  The veins may then become irritated and painful.  External Hemorrhoids  External hemorrhoids can be easily seen or felt around the anal opening.  They are under the skin around the anus.  When the swollen veins are scratched or broken by straining, rubbing or wiping they sometimes bleed.  How  Hemorrhoids Occur  Veins in the rectum and around the anus tend to swell under pressure.  Hemorrhoids can result from increased pressure in the veins of your anus or rectum.  Some sources of pressure are:  Straining to have a bowel movement because of constipation Waiting too long to have a bowel movement Coughing and sneezing often Sitting for extended periods of time, including on the toilet Diarrhea Obesity Trauma or injury to the anus Some liver diseases Stress Family  history of hemorrhoids Pregnancy  Pregnant women should try to avoid becoming constipated, because they are more likely to have hemorrhoids during pregnancy.  In the last trimester of pregnancy, the enlarged uterus may press on blood vessels and causes hemorrhoids.  In addition, the strain of childbirth sometimes causes hemorrhoids after the birth.  Symptoms of Hemorrhoids  Some symptoms of hemorrhoids include: Swelling and/or a tender lump around the anus Itching, mild burning and bleeding around the anus Painful bowel movements with or without constipation Bright red blood covering the stool, on toilet paper or in the toilet bowel.   Symptoms usually go away within a few days.  Always talk to your doctor about any bleeding to make sure it is not from some other causes.  Diagnosing and Treating Hemorrhoids  Diagnosis is made by an examination by your healthcare provider.  Special test can be performed by your doctor.    Most cases of hemorrhoids can be treated with: High-fiber diet: Eat more high-fiber foods, which help prevent constipation.  Ask for more detailed fiber information on types and sources of fiber from your healthcare provider. Fluids: Drink plenty of water.  This helps soften bowel movements so they are easier to pass. Sitz baths and cold packs: Sitting in lukewarm water two or three times a day for 15 minutes cleases the anal area and may relieve discomfort.  If the water is too hot, swelling around the anus will get worse.  Placing a cloth-covered ice pack on the anus for ten minutes four times a day can also help reduce selling.  Gently pushing a prolapsed hemorrhoid back inside after the bath or ice pack can be helpful. Medications: For mild discomfort, your healthcare provider may suggest over-the-counter pain medication or prescribe a cream or ointment for topical use.  The cream may contain witch hazel, zinc oxide or petroleum jelly.  Medicated suppositories are also a  treatment option.  Always consult your doctor before applying medications or creams. Procedures and surgeries: There are also a number of procedures and surgeries to shrink or remove hemorrhoids in more serious cases.  Talk to your physician about these options.  You can often prevent hemorrhoids or keep them from becoming worse by maintaining a healthy lifestyle.  Eat a fiber-rich diet of fruits, vegetables and whole grains.  Also, drink plenty of water and exercise regularly.   2007, [employer] Doc.30    Diverticulosis Diverticulosis is a condition that develops when small pouches (diverticula) form in the wall of the large intestine (colon). The colon is where water is absorbed and stool (feces) is formed. The pouches form when the inside layer of the colon pushes through weak spots in the outer layers of the colon. You may have a few pouches or many of them. The pouches usually do not cause problems unless they become inflamed or infected. When this happens, the condition is called diverticulitis- this is left lower quadrant pain, diarrhea, fever, chills, nausea or vomiting.  If this occurs please call  the office or go to the hospital. Sometimes these patches without inflammation can also have painless bleeding associated with them, if this happens please call the office or go to the hospital. Preventing constipation and increasing fiber can help reduce diverticula and prevent complications. Even if you feel you have a high-fiber diet, suggest getting on Benefiber or Cirtracel 2 times daily.  Here some information about pelvic floor dysfunction. This may be contributing to some of your symptoms. We will continue with our evaluation but I do want you to consider adding on fiber supplement with low-dose MiraLAX daily. We could also refer to pelvic floor physical therapy.   Pelvic Floor Dysfunction, Female Pelvic floor dysfunction (PFD) is a condition that results when the  group of muscles and connective tissues that support the organs in the pelvis (pelvic floor muscles) do not work well. These muscles and their connections form a sling that supports the colon and bladder. In women, they also support the uterus. PFD causes pelvic floor muscles to be too weak, too tight, or both. In PFD, muscle movements are not coordinated. This may cause bowel or bladder problems. It may also cause pain. What are the causes? This condition may be caused by an injury to the pelvic area or by a weakening of pelvic muscles. This often results from pregnancy and childbirth or other types of strain. In many cases, the exact cause is not known. What increases the risk? The following factors may make you more likely to develop this condition: Having chronic bladder tissue inflammation (interstitial cystitis). Being an older person. Being overweight. History of radiation treatment for cancer in the pelvic region. Previous pelvic surgery, such as removal of the uterus (hysterectomy). What are the signs or symptoms? Symptoms of this condition vary and may include: Bladder symptoms, such as: Trouble starting urination and emptying the bladder. Frequent urinary tract infections. Leaking urine when coughing, laughing, or exercising (stress incontinence). Having to pass urine urgently or frequently. Pain when passing urine. Bowel symptoms, such as: Constipation. Urgent or frequent bowel movements. Incomplete bowel movements. Painful bowel movements. Leaking stool or gas. Unexplained genital or rectal pain. Genital or rectal muscle spasms. Low back pain. Other symptoms may include: A heavy, full, or aching feeling in the vagina. A bulge that protrudes into the vagina. Pain during or after sex. How is this diagnosed? This condition may be diagnosed based on: Your symptoms and medical history. A physical exam. During the exam, your health care provider may check your pelvic muscles  for tightness, spasm, pain, or weakness. This may include a rectal exam and a pelvic exam. In some cases, you may have diagnostic tests, such as: Electrical muscle function tests. Urine flow testing. X-ray tests of bowel function. Ultrasound of the pelvic organs. How is this treated? Treatment for this condition depends on the symptoms. Treatment options include: Physical therapy. This may include Kegel exercises to help relax or strengthen the pelvic floor muscles. Biofeedback. This type of therapy provides feedback on how tight your pelvic floor muscles are so that you can learn to control them. Internal or external massage therapy. A treatment that involves electrical stimulation of the pelvic floor muscles to help control pain (transcutaneous electrical nerve stimulation, or TENS). Sound wave therapy (ultrasound) to reduce muscle spasms. Medicines, such as: Muscle relaxants. Bladder control medicines. Surgery to reconstruct or support pelvic floor muscles may be an option if other treatments do not help. Follow these instructions at home: Activity Do your usual activities as  told by your health care provider. Ask your health care provider if you should modify any activities. Do pelvic floor strengthening or relaxing exercises at home as told by your physical therapist. Lifestyle Maintain a healthy weight. Eat foods that are high in fiber, such as beans, whole grains, and fresh fruits and vegetables. Limit foods that are high in fat and processed sugars, such as fried or sweet foods. Manage stress with relaxation techniques such as yoga or meditation. General instructions If you have problems with leakage: Use absorbable pads or wear padded underwear. Wash frequently with mild soap. Keep your genital and anal area as clean and dry as possible. Ask your health care provider if you should try a barrier cream to prevent skin irritation. Take warm baths to relieve pelvic muscle tension  or spasms. Take over-the-counter and prescription medicines only as told by your health care provider. Keep all follow-up visits. How is this prevented? The cause of PFD is not always known, but there are a few things you can do to reduce the risk of developing this condition, including: Staying at a healthy weight. Getting regular exercise. Managing stress. Contact a health care provider if: Your symptoms are not improving with home care. You have signs or symptoms of PFD that get worse at home. You develop new signs or symptoms. You have signs of a urinary tract infection, such as: Fever. Chills. Increased urinary frequency. A burning feeling when urinating. You have not had a bowel movement in 3 days (constipation). Summary Pelvic floor dysfunction results when the muscles and connective tissues in your pelvic floor do not work well. These muscles and their connections form a sling that supports your colon and bladder. In women, they also support the uterus. PFD may be caused by an injury to the pelvic area or by a weakening of pelvic muscles. PFD causes pelvic floor muscles to be too weak, too tight, or a combination of both. Symptoms may vary from person to person. In most cases, PFD can be treated with physical therapies and medicines. Surgery may be an option if other treatments do not help. This information is not intended to replace advice given to you by your health care provider. Make sure you discuss any questions you have with your health care provider. Document Revised: 08/12/2020 Document Reviewed: 08/12/2020 Elsevier Patient Education  2022 Elsevier Inc.  Abdominal bloating and discomfort may be due to intestinal sensitivity or symptoms of irritable bowel syndrome. To relieve symptoms, avoid:  Broccoli  Baked beans  Cabbage  Carbonated drinks  Cauliflower  Chewing gum  Hard candy Abdominal distention resulting from weak abdominal muscles:  Is better in the  morning  Gets worse as the day progresses  Is relieved by lying down Flatulence is gas created through bacterial action in the bowel and passed rectally. Keep in mind that:  10-18 passages per day are normal  Primary gases are harmless and odorless  Noticeable smells are trace gases related to food intake Foods to AVOID that are likely to form gas include:  Milk, dairy products, and medications that contain lactose--If your body doesn't produce the enzyme (lactase) to break it down.  Certain vegetables--baked beans, cauliflower, broccoli, cabbage  Certain starches--wheat, oats, corn, potatoes. Rice is a good substitute. Identify offending foods. Reduce or eliminate these gas-forming foods from your diet. Can look at the FODMAP diet.   _______________________________________________________  If your blood pressure at your visit was 140/90 or greater, please contact your primary care physician to  follow up on this.  _______________________________________________________  If you are age 73 or older, your body mass index should be between 23-30. Your Body mass index is 22.11 kg/m. If this is out of the aforementioned range listed, please consider follow up with your Primary Care Provider.  If you are age 26 or younger, your body mass index should be between 19-25. Your Body mass index is 22.11 kg/m. If this is out of the aformentioned range listed, please consider follow up with your Primary Care Provider.   ________________________________________________________  The [facility] providers would like to encourage you to use MYCHART to communicate with providers for non-urgent requests or questions.  Due to long hold times on the telephone, sending your provider a message by [facility] may be a faster and more efficient way to get a response.  Please allow 48 business hours for a response.  Please remember that this is for non-urgent requests.   _______________________________________________________  [facility] is using a team-based approach to care.  Your team is made up of your doctor and two to three APPS. Our APPS (Nurse Practitioners and Physician Assistants) work with your physician to ensure care continuity for you. They are fully qualified to address your health concerns and develop a treatment plan. They communicate directly with your gastroenterologist to care for you. Seeing the Advanced Practice Practitioners on your physician's team can help you by facilitating care more promptly, often allowing for earlier appointments, access to diagnostic testing, procedures, and other specialty referrals.

## 2023-12-21 ENCOUNTER — Telehealth: Payer: Self-pay | Admitting: Physician Assistant

## 2023-12-21 ENCOUNTER — Ambulatory Visit: Payer: Self-pay | Admitting: Physician Assistant

## 2023-12-21 DIAGNOSIS — R700 Elevated erythrocyte sedimentation rate: Secondary | ICD-10-CM

## 2023-12-21 DIAGNOSIS — N289 Disorder of kidney and ureter, unspecified: Secondary | ICD-10-CM

## 2023-12-21 DIAGNOSIS — K625 Hemorrhage of anus and rectum: Secondary | ICD-10-CM

## 2023-12-21 DIAGNOSIS — D72829 Elevated white blood cell count, unspecified: Secondary | ICD-10-CM

## 2023-12-21 NOTE — Telephone Encounter (Signed)
 Inbound call from patients husband stating they would like a copy of lab results from yesterday 12/20/23 sent to them through mail.  Please advise  Thank you

## 2023-12-21 NOTE — Telephone Encounter (Signed)
 9/3 lab results mailed to patient with recommendations.

## 2023-12-25 ENCOUNTER — Ambulatory Visit (INDEPENDENT_AMBULATORY_CARE_PROVIDER_SITE_OTHER)
Admission: RE | Admit: 2023-12-25 | Discharge: 2023-12-25 | Disposition: A | Discharge status: Home / Self Care | Source: Ambulatory Visit | Attending: Physician Assistant | Admitting: Physician Assistant

## 2023-12-25 DIAGNOSIS — R700 Elevated erythrocyte sedimentation rate: Secondary | ICD-10-CM

## 2023-12-25 DIAGNOSIS — N289 Disorder of kidney and ureter, unspecified: Secondary | ICD-10-CM | POA: Diagnosis not present

## 2023-12-25 DIAGNOSIS — K625 Hemorrhage of anus and rectum: Secondary | ICD-10-CM

## 2023-12-25 DIAGNOSIS — D72829 Elevated white blood cell count, unspecified: Secondary | ICD-10-CM | POA: Diagnosis not present

## 2023-12-29 ENCOUNTER — Telehealth: Payer: Self-pay | Admitting: Physician Assistant

## 2023-12-29 NOTE — Telephone Encounter (Signed)
 Inbound cal from pt husband stating that his wife had down some blood work with Alan Coombs and was wondering if the nurse could return his call in order to go over those lab results with him. Please advise.

## 2023-12-29 NOTE — Telephone Encounter (Signed)
 I have attempted to call home phone again x 2 & husband's cell with no answer.

## 2024-01-01 NOTE — Telephone Encounter (Signed)
 Spoke with patient's husband. See x-ray and CT result note for details.

## 2024-01-01 NOTE — Telephone Encounter (Signed)
 Attempted to reach patient's husband on his mobile number. Received automated message that call can't be completed as dialed.   Lm on home vm for patient's husband to return call.

## 2024-02-07 ENCOUNTER — Telehealth: Payer: Self-pay | Admitting: Physician Assistant

## 2024-02-07 MED ORDER — ONDANSETRON 4 MG PO TBDP: 4.0000 mg | ORAL_TABLET | Freq: Three times a day (TID) | ORAL | 0 refills | Status: AC | PRN

## 2024-02-07 NOTE — Telephone Encounter (Signed)
 Spoke with patient's husband. The patient had some nausea with Vomiting this morning about 30 minutes after taking the 32 oz of Miralax for her 2 day prep in preparation for Colonoscopy tomorrow. Will send in Rx for Zofran 4 mg PO for the patient to take 30 min prior to taking the Suprep tonight and in the am. Also advised the patient to contact the On call MD if she has any further problems later this evening. PTs spouse verbalized understanding.

## 2024-02-07 NOTE — Telephone Encounter (Signed)
 PT is prepping for a colonosocpy on tomorrow and had a hard time keeping the Miralax down. She vomited it up and 45 min later she drank 8 oz and was able to keep that down. Requesting call back.

## 2024-02-08 ENCOUNTER — Ambulatory Visit (AMBULATORY_SURGERY_CENTER): Admitting: Gastroenterology

## 2024-02-08 ENCOUNTER — Encounter: Payer: Self-pay | Admitting: Gastroenterology

## 2024-02-08 VITALS — BP 133/68 | HR 98 | Temp 96.9°F | Resp 13 | Ht 61.0 in | Wt 117.0 lb

## 2024-02-08 DIAGNOSIS — K625 Hemorrhage of anus and rectum: Secondary | ICD-10-CM

## 2024-02-08 DIAGNOSIS — K6389 Other specified diseases of intestine: Secondary | ICD-10-CM

## 2024-02-08 DIAGNOSIS — K641 Second degree hemorrhoids: Secondary | ICD-10-CM

## 2024-02-08 DIAGNOSIS — K5730 Diverticulosis of large intestine without perforation or abscess without bleeding: Secondary | ICD-10-CM | POA: Diagnosis not present

## 2024-02-08 MED ORDER — SODIUM CHLORIDE 0.9 % IV SOLN: 500.0000 mL | Freq: Once | INTRAVENOUS | Status: AC

## 2024-02-08 NOTE — Patient Instructions (Signed)
 Thank you for letting us  take care of your healthcare needs today. Please see handouts given to you on Diverticulosis, Hemorrhoids and High Fiber Diet. Use Annusol Hydrocortisone  cream 2.5% twice a day for 2 weeks. Use 1 TBS of Metamucil daily.    YOU HAD AN ENDOSCOPIC PROCEDURE TODAY AT THE [facility]:   Refer to the procedure report that was given to you for any specific questions about what was found during the examination.  If the procedure report does not answer your questions, please call your gastroenterologist to clarify.  If you requested that your care partner not be given the details of your procedure findings, then the procedure report has been included in a sealed envelope for you to review at your convenience later.  YOU SHOULD EXPECT: Some feelings of bloating in the abdomen. Passage of more gas than usual.  Walking can help get rid of the air that was put into your GI tract during the procedure and reduce the bloating. If you had a lower endoscopy (such as a colonoscopy or flexible sigmoidoscopy) you may notice spotting of blood in your stool or on the toilet paper. If you underwent a bowel prep for your procedure, you may not have a normal bowel movement for a few days.  Please Note:  You might notice some irritation and congestion in your nose or some drainage.  This is from the oxygen used during your procedure.  There is no need for concern and it should clear up in a day or so.  SYMPTOMS TO REPORT IMMEDIATELY:  Following lower endoscopy (colonoscopy or flexible sigmoidoscopy):  Excessive amounts of blood in the stool  Significant tenderness or worsening of abdominal pains  Swelling of the abdomen that is new, acute  Fever of 100F or higher   For urgent or emergent issues, a gastroenterologist can be reached at any hour by calling [phone]. Do not use MyChart messaging for urgent concerns.    DIET:  We do recommend a small meal at first, but then you  may proceed to your regular diet.  Drink plenty of fluids but you should avoid alcoholic beverages for 24 hours.  ACTIVITY:  You should plan to take it easy for the rest of today and you should NOT DRIVE or use heavy machinery until tomorrow (because of the sedation medicines used during the test).    FOLLOW UP: Our staff will call the number listed on your records the next business day following your procedure.  We will call around 7:15- 8:00 am to check on you and address any questions or concerns that you may have regarding the information given to you following your procedure. If we do not reach you, we will leave a message.     If any biopsies were taken you will be contacted by phone or by letter within the next 1-3 weeks.  Please call us  at [phone] if you have not heard about the biopsies in 3 weeks.    SIGNATURES/CONFIDENTIALITY: You and/or your care partner have signed paperwork which will be entered into your electronic medical record.  These signatures attest to the fact that that the information above on your After Visit Summary has been reviewed and is understood.  Full responsibility of the confidentiality of this discharge information lies with you and/or your care-partner.

## 2024-02-08 NOTE — Progress Notes (Signed)
 Report to PACU, RN, vss, BBS= Clear.

## 2024-02-08 NOTE — Progress Notes (Signed)
 12/20/2023 Sheryl Ball [id number] 22-Dec-1945   Referring provider: Gable Cambric, MD Primary GI doctor: Dr. Charlanne   ASSESSMENT AND PLAN:  Rectal bleeding with fecal incontinence/smearing for at least a year Small volume, can be without a BM, has to wear a pad, worse with moving around No rectal pain, itching, burning She does have constipation worsening with hydrocodone use Anoscopy with inflamed right anterior hemorrhoid with polyp 10/2012 colonoscopy mild sigmoid diverticulosis, highly redundant colon, internal hemorrhoids otherwise normal TI Recall 10 years -Will get KUB, consider CT -Sitz baths, increase fiber, increase water -Hydrocortisone  supp given and external cream sent in.  - will schedule for colonoscopy with Dr. Charlanne as patient is healthy enough and last colon was 2014. We have discussed the risks of bleeding, infection, perforation, medication reactions, and remote risk of death associated with colonoscopy. All questions were answered and the patient acknowledges these risk and wishes to proceed. -We discussed hemorrhoid banding here in the office for internal hemorrhoids if not improving with conservative treatment and colonoscopy normal   Constipation Likely due to hydrocodone use and pelvic floor Get Xray, consider CT scan Will do 2 day prep with history of constipation Constipation likely due to hydrocodone use, contributing to hemorrhoid inflammation and bleeding. Discussed alternatives for pain management covered by Medicare. - Initiate Miralax and fiber supplement such as Benefiber or Citrucel. - Discuss with primary care provider about alternatives to hydrocodone. - consider pelvic floor PT - consider linzess if not helping   GERD with history of dysphagia 01/10/2017 barium swallow small HH, subtle gastroesophageal junction ring did not delay passage of 13 mm barium tablet 01/12/2018 EGD Schatzki ring mild stenosis status post dilation, small transient HH, erosive  gastritis.   Negative H. pylori negative EOE. No symptoms at this time Continue PPI   Patient Care Team: Gable Cambric, MD as PCP - General (Internal Medicine)   HISTORY OF PRESENT ILLNESS: 78 y.o. female with a past medical history listed below presents for evaluation of rectal bleeding.    Last seen in the office 12/29/2017 by Dr. Charlanne for esophageal dysphagia. Husband reggie is here and provides some of the history.    Discussed the use of AI scribe software for clinical note transcription with the patient, who gave verbal consent to proceed.   History of Present Illness   Sheryl Ball is an 78 year old female with diverticulosis and internal hemorrhoids who presents with rectal bleeding. She is accompanied by her husband, Reg. She has experienced rectal bleeding for approximately one to two years, with more consistent symptoms over the past year. The bleeding is bright red, not a large amount, but she wears a pad daily to avoid staining her underwear. It is more noticeable when she is active, often present on toilet paper and occasionally on a pad, but rarely in the toilet. No associated rectal pain, pressure, itching, or burning.   She has a history of diverticulosis, identified during a colonoscopy in July 2014, and internal hemorrhoids. She is consistently constipated, having bowel movements approximately twice a week. She takes hydrocodone once daily for pain management, contributing to her constipation. She also takes pantoprazole  and Pepcid  for reflux, which is currently controlled. No nausea, abdominal pain, or changes in bowel habits aside from constipation.   Her current medications include hydrocodone, doxycycline, rosuvastatin, pantoprazole , Pepcid , Cymbalta, and bupropion. She receives Reclast twice a year for bone health and has a history of using tramadol, which was switched to hydrocodone. She denies using  any pain patches currently.   She reports a family history of  hemorrhoids, as her mother also had them, but she is unsure if her mother experienced similar bleeding. She has two sons and attributes some of her symptoms to childbirth. She experiences significant gas, which she finds difficult to control, and certain foods may exacerbate this symptom.           She  reports that she has never smoked. She has never used smokeless tobacco. She reports that she does not drink alcohol and does not use drugs.   RELEVANT GI HISTORY, IMAGING AND LABS: Results   DIAGNOSTIC Colonoscopy: Diverticulosis and internal hemorrhoids (10/2012)     Past GI procedures: -EGD with esophageal dilatation 02/2010: Schatzki's ring status post esophageal dilatation 48 F4 Maloney, small hiatal hernia, negative biopsies for EoE. -Screening colonoscopy 10/2012: Mild sigmoid diverticulosis, highly redundant colon, internal hemorrhoids.  Otherwise normal to TI.  Repeat in 10 years.  Earlier, if any problems CBC Labs (Brief)          Component Value Date/Time    WBC 8.6 10/24/2019 1146    RBC 4.42 10/24/2019 1146    HGB 13.6 10/24/2019 1146    HCT 41.2 10/24/2019 1146    PLT 381 10/24/2019 1146    MCV 93 10/24/2019 1146    MCH 30.8 10/24/2019 1146    MCHC 33.0 10/24/2019 1146    RDW 13.0 10/24/2019 1146    LYMPHSABS 2.4 10/24/2019 1146    EOSABS 0.2 10/24/2019 1146    BASOSABS 0.1 10/24/2019 1146      Recent Labs (within last 365 days)  No results for input(s): HGB in the last 8760 hours.     CMP     Labs (Brief)  No results found for: NA, K, CL, CO2, GLUCOSE, BUN, CREATININE, CALCIUM, PROT, ALBUMIN, AST, ALT, ALKPHOS, BILITOT, GFRNONAA, GFRAA         No data to display             Current Medications:    Current Outpatient Medications (Endocrine & Metabolic):    zoledronic acid (RECLAST) 5 MG/100ML SOLN injection, Inject 5 mg into the vein once.   Current Outpatient Medications (Cardiovascular):    rosuvastatin (CRESTOR)  40 MG tablet, TAKE 1 TABLET BY MOUTH EVERYDAY AT BEDTIME   Current Outpatient Medications (Respiratory):    azelastine  (ASTELIN ) 0.1 % nasal spray, Use one spray in each nostril twice daily as directed   montelukast (SINGULAIR) 10 MG tablet, Take 10 mg by mouth at bedtime.   triamcinolone  (NASACORT  ALLERGY  24HR) 55 MCG/ACT AERO nasal inhaler, Place 1 spray into the nose daily.   Current Outpatient Medications (Analgesics):    Acetaminophen (TYLENOL PO), Take by mouth as needed.   HYDROcodone-acetaminophen (NORCO/VICODIN) 5-325 MG tablet, Take 1 tablet by mouth 2 (two) times daily as needed.   Current Outpatient Medications (Hematological):    vitamin B-12 (CYANOCOBALAMIN) 1000 MCG tablet, Take by mouth.   Current Outpatient Medications (Other):    Biotin 10000 MCG TABS, Take by mouth daily.   busPIRone (BUSPAR) 10 MG tablet, Take 10 mg by mouth daily.   Cholecalciferol (VITAMIN D3 PO), Take by mouth.   Coenzyme Q10 (COQ-10) 200 MG CAPS, Take 1 tablet by mouth daily.   doxycycline (PERIOSTAT) 20 MG tablet, Take 20 mg by mouth 2 (two) times daily with a meal.   DULoxetine (CYMBALTA) 60 MG capsule, Take 2 capsules by mouth daily.   famotidine  (PEPCID ) 40 MG tablet, Take one  tablet once daily as directed   hydrocortisone  (ANUSOL -HC) 2.5 % rectal cream, Place 1 Application rectally 2 (two) times daily.   hydrocortisone  (ANUSOL -HC) 25 MG suppository, Place 1 suppository (25 mg total) rectally 2 (two) times daily.   memantine (NAMENDA) 10 MG tablet, Take 10 mg by mouth daily.   Multiple Vitamin (MULTIVITAMIN) tablet, Take 1 tablet by mouth daily.   oxybutynin (DITROPAN-XL) 10 MG 24 hr tablet, Take 10 mg by mouth daily.   pantoprazole  (PROTONIX ) 40 MG tablet, Take 1 tablet (40 mg total) by mouth daily.   Vitamin D, Ergocalciferol, (DRISDOL) 1.25 MG (50000 UT) CAPS capsule, TAKE 1 CAPSULE ONE TIME PER WEEK   buPROPion (WELLBUTRIN XL) 300 MG 24 hr tablet, Take 300 mg by mouth daily.   Medical  History:      Past Medical History:  Diagnosis Date   Anxiety     Arthritis     Depression     GERD (gastroesophageal reflux disease)     Hypercholesteremia     IBS (irritable bowel syndrome)     Mitral valve prolapse     Osteoarthritis     Panic disorder          Allergies:  Allergies      Allergies  Allergen Reactions   Morphine Sulfate Nausea And Vomiting   Prednisone          Surgical History:  She  has a past surgical history that includes Colonoscopy (11/09/2012); Colonoscopy with esophagogastroduodenoscopy (egd); Esophagogastroduodenoscopy (03/10/2010); Hernia repair (1998); Abdominal hysterectomy; Back surgery; Bladder surgery; and Tonsillectomy. Family History:  Her family history includes Bone cancer in her father.   REVIEW OF SYSTEMS  : All other systems reviewed and negative except where noted in the History of Present Illness.   PHYSICAL EXAM: BP 124/62 (BP Location: Left Arm, Patient Position: Sitting, Cuff Size: Normal)   Pulse 77   Ht 5' 1 (1.549 m)   Wt 117 lb (53.1 kg)   BMI 22.11 kg/m  Physical Exam   GENERAL APPEARANCE: Well nourished, in no apparent distress. HEENT: No cervical lymphadenopathy, unremarkable thyroid , sclerae anicteric, conjunctiva pink. RESPIRATORY: Respiratory effort normal, BS equal bilateral without rales, rhonchi, wheezing. CARDIO: RRR with no MRGs, peripheral pulses intact. ABDOMEN: Soft, non distended, active bowel sounds in all 4 quadrants, no tenderness to palpation, no rebound, no mass appreciated. RECTAL: Hemorrhoidal skin tag posterior right. No fissures or abnormal findings. No stool palpable in rectum. Right posterior and anterior hemorrhoids enlarged and inflamed. Decreased rectal tone. MUSCULOSKELETAL: Full ROM, normal gait, without edema. SKIN: Dry, intact without rashes or lesions. No jaundice. NEURO: Alert, oriented, no focal deficits. PSYCH: Cooperative, normal mood and affect.     Rectal:  External exam:  external rectal skin tags Rectal tone: decreased rectal tone Internal exam: non-tender, no masses, No stool in vault,  hemoccult Negative Anoscopy was performed after rectal exam with the patient in the left lateral decubitus position and revealed Right anterior hemorrhoids inflamed with white polyp Right posterior hemorrhoids   Alan JONELLE Coombs, PA-C   Attending physician's note   I have taken history, reviewed the chart and examined the patient. I performed a substantive portion of this encounter, including complete performance of at least one of the key components, in conjunction with the APP. I agree with the Advanced Practitioner's note, impression and recommendations.    Anselm Bring, MD [facility] [phone]

## 2024-02-08 NOTE — Progress Notes (Signed)
 I have reviewed the patient's medical history in detail and updated the computerized patient record.

## 2024-02-08 NOTE — Op Note (Signed)
 [facility] Patient Name: Sheryl Ball Procedure Date: 02/08/2024 2:26 PM MRN: [id number] Endoscopist: Lynnie Bring , MD, [id number] Age: 78 Referring MD:  Date of Birth: [birth year] Gender: Female Account #: [id number] Procedure:                Colonoscopy Indications:              Rectal bleeding Medicines:                Monitored Anesthesia Care Procedure:                Pre-Anesthesia Assessment:                           - Prior to the procedure, a History and Physical                            was performed, and patient medications and                            allergies were reviewed. The patient's tolerance of                            previous anesthesia was also reviewed. The risks                            and benefits of the procedure and the sedation                            options and risks were discussed with the patient.                            All questions were answered, and informed consent                            was obtained. Prior Anticoagulants: The patient has                            taken no anticoagulant or antiplatelet agents. ASA                            Grade Assessment: II - A patient with mild systemic                            disease. After reviewing the risks and benefits,                            the patient was deemed in satisfactory condition to                            undergo the procedure.                           After obtaining informed consent, the colonoscope  was passed under direct vision. Throughout the                            procedure, the patient's blood pressure, pulse, and                            oxygen saturations were monitored continuously. The                            Olympus Scope [id number] was introduced through the                            anus and advanced to the 2 cm into the ileum. The                            colonoscopy was somewhat difficult due to  multiple                            diverticula in the colon. Successful completion of                            the procedure was aided by lavage. The patient                            tolerated the procedure well. The quality of the                            bowel preparation was good. The terminal ileum,                            ileocecal valve, appendiceal orifice, and rectum                            were photographed. Scope In: 3:10:18 PM Scope Out: 3:26:39 PM Scope Withdrawal Time: 0 hours 6 minutes 59 seconds  Total Procedure Duration: 0 hours 16 minutes 21 seconds  Findings:                 Multiple medium-mouthed diverticula were found in                            the sigmoid colon with luminal narrowing consistent                            with muscular hypertrophy. Rare few diverticula in                            the ascending colon and in the descending colon.                           Non-bleeding internal hemorrhoids were found during                            retroflexion and during perianal exam. The  hemorrhoids were moderate and Grade II (internal                            hemorrhoids that prolapse but reduce spontaneously).                           The terminal ileum appeared normal.                           Retroflexion in the right colon was performed.                           The exam was otherwise without abnormality on                            direct and retroflexion views. Complications:            No immediate complications. Estimated Blood Loss:     Estimated blood loss: none. Impression:               - Moderate predominantly sigmoid diverticulosis.                           - Non-bleeding internal hemorrhoids.                           - The examined portion of the ileum was normal.                           - The examination was otherwise normal on direct                            and retroflexion views.                            - No specimens collected. Recommendation:           - Patient has a contact number available for                            emergencies. The signs and symptoms of potential                            delayed complications were discussed with the                            patient. Return to normal activities tomorrow.                            Written discharge instructions were provided to the                            patient.                           - High fiber diet.                           -  Continue present medications.                           - Repeat colonoscopy is not recommended for                            screening purposes.                           - Use HC Cream 2.5%: Apply externally BID for 2                            weeks. 2RF                           - The findings and recommendations were discussed                            with the patient's family. Lynnie Bring, MD 02/08/2024 3:31:45 PM This report has been signed electronically.

## 2024-02-09 ENCOUNTER — Telehealth: Payer: Self-pay

## 2024-02-09 NOTE — Telephone Encounter (Signed)
  Follow up Call-     02/08/2024    2:07 PM  Call back number  Post procedure Call Back phone  # [phone]  Permission to leave phone message Yes     Patient questions:  Do you have a fever, pain , or abdominal swelling? No. Pain Score  0 *  Have you tolerated food without any problems? Yes.    Have you been able to return to your normal activities? Yes.    Do you have any questions about your discharge instructions: Diet   No. Medications  No. Follow up visit  No.  Do you have questions or concerns about your Care? Yes.     Spoke with patient's husband. He says wife has a mild sore throat and wiil have her call if she has any more concerns. Actions: * If pain score is 4 or above: No action needed, pain <4.
